# Patient Record
Sex: Female | Born: 1935
Health system: Southern US, Community
[De-identification: ages and names within clinical notes are randomized; demographics above are authoritative.]

## PROBLEM LIST (undated history)

## (undated) DIAGNOSIS — R112 Nausea with vomiting, unspecified: Secondary | ICD-10-CM

## (undated) DIAGNOSIS — E119 Type 2 diabetes mellitus without complications: Secondary | ICD-10-CM

## (undated) DIAGNOSIS — J45909 Unspecified asthma, uncomplicated: Secondary | ICD-10-CM

## (undated) DIAGNOSIS — M75100 Unspecified rotator cuff tear or rupture of unspecified shoulder, not specified as traumatic: Secondary | ICD-10-CM

## (undated) DIAGNOSIS — Z9889 Other specified postprocedural states: Secondary | ICD-10-CM

## (undated) DIAGNOSIS — M1990 Unspecified osteoarthritis, unspecified site: Secondary | ICD-10-CM

## (undated) DIAGNOSIS — M75122 Complete rotator cuff tear or rupture of left shoulder, not specified as traumatic: Secondary | ICD-10-CM

## (undated) DIAGNOSIS — K219 Gastro-esophageal reflux disease without esophagitis: Secondary | ICD-10-CM

## (undated) DIAGNOSIS — E785 Hyperlipidemia, unspecified: Secondary | ICD-10-CM

## (undated) DIAGNOSIS — H409 Unspecified glaucoma: Secondary | ICD-10-CM

## (undated) DIAGNOSIS — I10 Essential (primary) hypertension: Secondary | ICD-10-CM

## (undated) HISTORY — DX: Essential (primary) hypertension: I10

## (undated) HISTORY — PX: CHOLECYSTECTOMY: SHX55

## (undated) HISTORY — PX: ABDOMINAL HYSTERECTOMY: SHX81

## (undated) HISTORY — DX: Unspecified glaucoma: H40.9

## (undated) HISTORY — DX: Unspecified rotator cuff tear or rupture of unspecified shoulder, not specified as traumatic: M75.100

## (undated) HISTORY — DX: Unspecified asthma, uncomplicated: J45.909

## (undated) HISTORY — PX: BUNIONECTOMY WITH HAMMERTOE RECONSTRUCTION: SHX5600

## (undated) HISTORY — DX: Type 2 diabetes mellitus without complications: E11.9

## (undated) HISTORY — PX: EYE SURGERY: SHX253

## (undated) HISTORY — PX: WRIST FRACTURE SURGERY: SHX121

## (undated) HISTORY — PX: BLEPHAROPLASTY: SUR158

## (undated) HISTORY — PX: TRIGGER FINGER RELEASE: SHX641

## (undated) HISTORY — DX: Hyperlipidemia, unspecified: E78.5

---

## 1999-04-25 ENCOUNTER — Ambulatory Visit (HOSPITAL_COMMUNITY): Admission: RE | Admit: 1999-04-25 | Discharge: 1999-04-25 | Payer: Self-pay | Admitting: *Deleted

## 1999-12-31 ENCOUNTER — Encounter: Admission: RE | Admit: 1999-12-31 | Discharge: 1999-12-31 | Payer: Self-pay | Admitting: *Deleted

## 1999-12-31 ENCOUNTER — Encounter: Payer: Self-pay | Admitting: *Deleted

## 2000-04-08 ENCOUNTER — Encounter: Admission: RE | Admit: 2000-04-08 | Discharge: 2000-07-07 | Payer: Self-pay | Admitting: *Deleted

## 2000-12-31 ENCOUNTER — Encounter: Payer: Self-pay | Admitting: Internal Medicine

## 2000-12-31 ENCOUNTER — Encounter: Admission: RE | Admit: 2000-12-31 | Discharge: 2000-12-31 | Payer: Self-pay | Admitting: Internal Medicine

## 2001-11-22 ENCOUNTER — Encounter: Payer: Self-pay | Admitting: Internal Medicine

## 2001-11-22 ENCOUNTER — Encounter: Admission: RE | Admit: 2001-11-22 | Discharge: 2001-11-22 | Payer: Self-pay | Admitting: Internal Medicine

## 2001-11-24 HISTORY — PX: BREAST EXCISIONAL BIOPSY: SUR124

## 2002-01-06 ENCOUNTER — Encounter: Admission: RE | Admit: 2002-01-06 | Discharge: 2002-01-06 | Payer: Self-pay | Admitting: Internal Medicine

## 2002-01-06 ENCOUNTER — Encounter (INDEPENDENT_AMBULATORY_CARE_PROVIDER_SITE_OTHER): Payer: Self-pay | Admitting: *Deleted

## 2002-01-06 ENCOUNTER — Encounter: Payer: Self-pay | Admitting: Internal Medicine

## 2002-01-12 ENCOUNTER — Encounter (INDEPENDENT_AMBULATORY_CARE_PROVIDER_SITE_OTHER): Payer: Self-pay | Admitting: *Deleted

## 2002-01-12 ENCOUNTER — Ambulatory Visit (HOSPITAL_BASED_OUTPATIENT_CLINIC_OR_DEPARTMENT_OTHER): Admission: RE | Admit: 2002-01-12 | Discharge: 2002-01-12 | Payer: Self-pay | Admitting: General Surgery

## 2003-02-21 ENCOUNTER — Encounter: Payer: Self-pay | Admitting: Internal Medicine

## 2003-02-21 ENCOUNTER — Encounter: Admission: RE | Admit: 2003-02-21 | Discharge: 2003-02-21 | Payer: Self-pay | Admitting: Internal Medicine

## 2004-03-19 ENCOUNTER — Encounter: Admission: RE | Admit: 2004-03-19 | Discharge: 2004-03-19 | Payer: Self-pay | Admitting: Internal Medicine

## 2005-01-02 ENCOUNTER — Encounter: Admission: RE | Admit: 2005-01-02 | Discharge: 2005-04-02 | Payer: Self-pay | Admitting: Internal Medicine

## 2005-03-21 ENCOUNTER — Encounter: Admission: RE | Admit: 2005-03-21 | Discharge: 2005-03-21 | Payer: Self-pay | Admitting: Internal Medicine

## 2006-04-22 ENCOUNTER — Encounter: Admission: RE | Admit: 2006-04-22 | Discharge: 2006-04-22 | Payer: Self-pay | Admitting: Internal Medicine

## 2007-05-17 ENCOUNTER — Encounter: Admission: RE | Admit: 2007-05-17 | Discharge: 2007-05-17 | Payer: Self-pay | Admitting: Otolaryngology

## 2007-06-03 ENCOUNTER — Encounter: Admission: RE | Admit: 2007-06-03 | Discharge: 2007-06-03 | Payer: Self-pay | Admitting: Internal Medicine

## 2007-06-08 ENCOUNTER — Encounter: Admission: RE | Admit: 2007-06-08 | Discharge: 2007-09-06 | Payer: Self-pay | Admitting: Otolaryngology

## 2007-08-27 ENCOUNTER — Encounter: Admission: RE | Admit: 2007-08-27 | Discharge: 2007-08-27 | Payer: Self-pay | Admitting: Orthopedic Surgery

## 2007-08-31 ENCOUNTER — Ambulatory Visit (HOSPITAL_BASED_OUTPATIENT_CLINIC_OR_DEPARTMENT_OTHER): Admission: RE | Admit: 2007-08-31 | Discharge: 2007-09-01 | Payer: Self-pay | Admitting: Orthopedic Surgery

## 2008-07-21 ENCOUNTER — Encounter: Admission: RE | Admit: 2008-07-21 | Discharge: 2008-07-21 | Payer: Self-pay | Admitting: Internal Medicine

## 2009-07-25 ENCOUNTER — Encounter: Admission: RE | Admit: 2009-07-25 | Discharge: 2009-07-25 | Payer: Self-pay | Admitting: Internal Medicine

## 2010-05-07 ENCOUNTER — Ambulatory Visit (HOSPITAL_BASED_OUTPATIENT_CLINIC_OR_DEPARTMENT_OTHER): Admission: RE | Admit: 2010-05-07 | Discharge: 2010-05-07 | Payer: Self-pay | Admitting: Orthopedic Surgery

## 2010-08-02 ENCOUNTER — Encounter: Admission: RE | Admit: 2010-08-02 | Discharge: 2010-08-02 | Payer: Self-pay | Admitting: Internal Medicine

## 2011-04-08 NOTE — Op Note (Signed)
NAME:  Cheryl Quinn, Cheryl Quinn              ACCOUNT NO.:  [id number]   MEDICAL RECORD NO.:  [id number]          PATIENT TYPE:  AMB   LOCATION:  DSC                          FACILITY:  [facility]   PHYSICIAN:  Katy Fitch. Sypher, M.D. DATE OF BIRTH:  05-02-1933   DATE OF PROCEDURE:  08/31/2007  DATE OF DISCHARGE:                               OPERATIVE REPORT   PREOPERATIVE DIAGNOSIS:  Chronic left supraspinatus rotator cuff  retracted tear with evidence of chronic stage III impingement and severe  AC arthropathy.   POSTOPERATIVE DIAGNOSIS:  Chronic left supraspinatus rotator cuff  retracted tear with evidence of chronic stage III impingement and severe  AC arthropathy with further identification of extensive labral  degenerative tearing and 50% long head of biceps rupture just proximal  to intertubercular groove and grade 2 chondromalacia of humeral head.   OPERATION:  1. Diagnostic arthroscopy left glenohumeral joint.  2. Arthroscopic debridement of labrum, biceps tendon degenerative      tearing and rotator cuff degenerative tear fragments.  3. Arthroscopic subacromial decompression with bursectomy,      coracoacromial ligament relaxation and resection of the capsule of      AC joint.  4. Arthroscopic resection distal clavicle.  5. Reconstruction of chronic supraspinatus rotator cuff retracted      triangular tear with a single BioCorkscrew medial footprint anchor      and a McLaughlin through bone approximation suture.   OPERATING SURGEON:  Katy Fitch. Sypher, M.D.   ASSISTANT:  Molly Maduro Dasnoit PA-C.   ANESTHESIA:  General by endotracheal technique supplemented by a left  interscalene block.   SUPERVISING ANESTHESIOLOGIST:  Janetta Hora. Gelene Mink, M.D.   INDICATIONS:  The patient is a 77 year old woman referred through the  courtesy of Dr. Kirby Funk of [facility].  She had a history of  hand pain and shoulder pain.   Clinical examination revealed signs of a significant left  shoulder AC  arthropathy, weakness of abduction, external rotation and plain film  evidence of a probable chronic rotator cuff tear.   After lengthy discussion in the office, the patient was sent for a MRI  of the left shoulder which documented severe AC arthropathy and  unfavorable acromial anatomy as well as a chronic retracted rotator cuff  tear.   After detailed informed consent, she decided to proceed with attempted  repair of her left rotator cuff.  She understood that we would  arthroscopically debride her shoulder, perform arthroscopic subacromial  decompression, distal clavicle resection and repair rotator cuff through  a muscle-splitting incision.   Given the chronic nature of her tear, in my judgment this was not  amendable to arthroscopic technique.   Preoperatively she was advised of the potential risks and benefits of  surgery.  She is noted be a type 2 diabetic controlled by diet only.  Her random blood glucose was 153 on the day prior to surgery.   After informed consent and after questions were invited and answered in  the holding area, she is brought to the operating room at this time  anticipating arthroscopic evaluation of her shoulder followed by the  rotator cuff reconstruction.   PROCEDURE:  The patient is brought to the operating room and placed in  the supine position upon the operating table.   Following anesthesia consult by Dr. Gelene Mink, general anesthesia by  endotracheal technique was recommended.  Dr. Gelene Mink placed an  interscalene block in the holding area without complication.   The patient was transferred to room #6, placed in supine position upon  the operating table and under Dr. Thornton Dales strict supervision,  general endotracheal anesthesia induced.   She was carefully positioned in the beach-chair position with the aid of  a torso and head holder designed for shoulder arthroscopy.  One gram of  Ancef was administered as IV  prophylactic antibiotic one hour prior to  surgery.   Procedure commenced with routine DuraPrep of the arm and forequarter  followed by application of impervious arthroscopy drapes.  The  arthroscope was introduced through a standard posterior viewing portal.  Diagnostic arthroscopy revealed intact hyaline articular cartilage  surfaces on the glenoid.  The humeral head had multiple areas of grade 2  chondromalacia and irregular surface without complete loss of the  hyaline cartilage.  The long head of the biceps had a 50% degenerative  tear approximately 2.5 cm from its insertion on the superior labrum.  The labral anchor of the biceps was sound.   An anterior portal was created under direct vision followed by use of a  4.5-mm suction shaver to debride the labrum.  The biceps and the deep  surface of the rotator cuff.   A rather sizable 2.5-3.5 cm full-thickness rotator cuff tear was noted  in the supraspinatus and this was retracted at least 3 cm medially with  a triangular defect.   After completion of the intra-articular debridement, digital images were  obtained to document the pathology, followed by removal of the  arthroscope.  The scope was then placed in the subacromial space,  followed by debridement of bursa.  The unfavorable AC anatomy was  identified and documented with a digital camera, followed by resection  of the AC capsule and partial release of the coracoacromial ligament.  The distal 15 mm of clavicle was removed arthroscopically with a suction  bur brought in anteriorly.  Bleeding was controlled with bipolar  cautery.  The acromial morphology was leveled with a suction shaver and  suction bur followed by hemostasis.   We then proceeded directly to open repair.  Due to the patient's  mesomorphic status, a significant incision was fashioned to expose the  deltoid beginning at the anterior margin of the acromion.  This was  brought laterally to expose the junction  between the anterior and middle  thirds of the deltoid.  This was split with a scissors followed by  release of less than 1 cm of the deltoid origin.   An Arthrex retractor was placed allowing excellent visualization of the  cuff tear.   This was a rather complex tear that uncovered the long head of the  biceps.  The long head of the biceps was quite degenerative and  fibrillated.   In my judgment, the patient is at significant risk for a late rupture of  the long head of the biceps given the degree of pathology identified.   The cuff margins were freshened and a large reactive osteophyte at the  greater tuberosity lowered by 4 mm with a power bur.  A BioCorkscrew  anchor was placed in the medial footprint of the repair followed by  triangulation of  the repair with a gathering suture of #2 FiberWire that  was then placed through tunnels to form a McLaughlin lateralization and  Advertising account planner.   Two mattress sutures were placed with a BioCorkscrew to inset the medial  footprint.   The defect was closed about 95% with a very small triangular lateral  defect which should scar over the decorticated tuberosity.   A very satisfactory profile repair was achieved.   After power irrigation of the subacromial space, the deltoid split was  repaired with a corner suture of #2 FiberWire anchored in the periosteum  of the acromion and deltoid origin, followed by multiple figure-of-eight  sutures of 0 Vicryl closing the deltoid split.   There were no apparent complications.   The patient's wound was then repaired with subcutaneous sutures of 2-0  Vicryl and intradermal 3-0 Prolene with Steri-Strips.  The patient was  then awakened from general anesthesia and transferred to the recovery  room with stable vital signs.  She will be admitted to the recovery care  center for observation of her vital signs and IV antibiotics in the form  of Ancef 1 gram IV q.8h. x3 doses and  appropriate analgesics in the form  of p.o. an IV Dilaudid and possible IV PCA morphine.      Katy Fitch Sypher, M.D.  Electronically Signed     RVS/MEDQ  D:  08/31/2007  T:  08/31/2007  Job:  [id number]   cc:   Thora Lance, M.D.

## 2011-04-11 NOTE — Op Note (Signed)
. [facility]  Patient:    Cheryl Quinn, Cheryl Quinn Visit Number: [id number] MRN: [id number]          Service Type: Attending:  Rose Phi. Maple Hudson, M.D. Dictated by:   Rose Phi. Maple Hudson, M.D. Proc. Date: 01/12/02                             Operative Report  PREOPERATIVE DIAGNOSIS:  Probable hemangioma of the right breast.  POSTOPERATIVE DIAGNOSIS:  Probable hemangioma of the right breast.  PROCEDURE:  Excision of right breast mass.  SURGEON:  Rose Phi. Maple Hudson, M.D.  ANESTHESIA:  MAC.  DESCRIPTION OF PROCEDURE:  This patient had a palpable lesion of the right breast, which had had a needle biopsy done on it which showed a vascular proliferation favoring a hemangioma with a recommendation for excision.  The patient was placed on the operating table with the right arm extended on the arm board.  The palpable nodule was outlined and then a curvilinear incision outlined by it.  The incision was then made and the hemangioma and the surrounding breast tissue were excised.  Hemostasis was obtained with the cautery.  Subcuticular closure of 4-0 Monocryl and Steri-Strips carried out. Dressing applied.  The patient transferred to the recovery room in satisfactory condition, having tolerated the procedure well. Dictated by:   Rose Phi. Maple Hudson, M.D. Attending:  Rose Phi. Maple Hudson, M.D. DD:  01/12/02 TD:  01/12/02 Job: [id number]

## 2011-07-16 ENCOUNTER — Other Ambulatory Visit: Payer: Self-pay | Admitting: Internal Medicine

## 2011-07-16 DIAGNOSIS — Z1231 Encounter for screening mammogram for malignant neoplasm of breast: Secondary | ICD-10-CM

## 2011-08-20 ENCOUNTER — Ambulatory Visit: Payer: Self-pay

## 2011-09-03 ENCOUNTER — Ambulatory Visit
Admission: RE | Admit: 2011-09-03 | Discharge: 2011-09-03 | Disposition: A | Discharge status: Home / Self Care | Payer: Medicare Other | Source: Ambulatory Visit | Attending: Internal Medicine | Admitting: Internal Medicine

## 2011-09-03 DIAGNOSIS — Z1231 Encounter for screening mammogram for malignant neoplasm of breast: Secondary | ICD-10-CM

## 2011-09-04 LAB — I-STAT 8, (EC8 V) (CONVERTED LAB)
BUN: 17
HCT: 47 — ABNORMAL HIGH
Hemoglobin: 16 — ABNORMAL HIGH
Operator id: 279391
TCO2: 29

## 2011-09-04 LAB — POCT HEMOGLOBIN-HEMACUE
Hemoglobin: 15.5 — ABNORMAL HIGH
Operator id: 208731

## 2011-09-09 ENCOUNTER — Other Ambulatory Visit: Payer: Self-pay | Admitting: Internal Medicine

## 2011-09-09 DIAGNOSIS — R928 Other abnormal and inconclusive findings on diagnostic imaging of breast: Secondary | ICD-10-CM

## 2011-09-15 ENCOUNTER — Encounter (HOSPITAL_BASED_OUTPATIENT_CLINIC_OR_DEPARTMENT_OTHER)
Admission: RE | Admit: 2011-09-15 | Discharge: 2011-09-15 | Disposition: A | Discharge status: Home / Self Care | Payer: Medicare Other | Source: Ambulatory Visit | Attending: Orthopedic Surgery | Admitting: Orthopedic Surgery

## 2011-09-15 LAB — BASIC METABOLIC PANEL
Chloride: 99 mEq/L (ref 96–112)
GFR calc Af Amer: >90 mL/min (ref >90)
Glucose, Bld: 171 mg/dL — ABNORMAL HIGH (ref 70–99)
Potassium: 3.5 mEq/L (ref 3.5–5.1)

## 2011-09-16 ENCOUNTER — Ambulatory Visit (HOSPITAL_BASED_OUTPATIENT_CLINIC_OR_DEPARTMENT_OTHER)
Admission: RE | Admit: 2011-09-16 | Discharge: 2011-09-16 | Disposition: A | Discharge status: Home / Self Care | Payer: Medicare Other | Source: Ambulatory Visit | Attending: Orthopedic Surgery | Admitting: Orthopedic Surgery

## 2011-09-16 DIAGNOSIS — S52599A Other fractures of lower end of unspecified radius, initial encounter for closed fracture: Secondary | ICD-10-CM | POA: Insufficient documentation

## 2011-09-16 DIAGNOSIS — I10 Essential (primary) hypertension: Secondary | ICD-10-CM | POA: Insufficient documentation

## 2011-09-16 DIAGNOSIS — E119 Type 2 diabetes mellitus without complications: Secondary | ICD-10-CM | POA: Insufficient documentation

## 2011-09-16 DIAGNOSIS — W19XXXA Unspecified fall, initial encounter: Secondary | ICD-10-CM | POA: Insufficient documentation

## 2011-09-16 DIAGNOSIS — Z01810 Encounter for preprocedural cardiovascular examination: Secondary | ICD-10-CM | POA: Insufficient documentation

## 2011-09-16 DIAGNOSIS — Z01812 Encounter for preprocedural laboratory examination: Secondary | ICD-10-CM | POA: Insufficient documentation

## 2011-09-16 DIAGNOSIS — Y929 Unspecified place or not applicable: Secondary | ICD-10-CM | POA: Insufficient documentation

## 2011-09-16 LAB — GLUCOSE, CAPILLARY
Glucose-Capillary: 145 mg/dL — ABNORMAL HIGH (ref 70–99)
Glucose-Capillary: 156 mg/dL — ABNORMAL HIGH (ref 70–99)

## 2011-09-22 ENCOUNTER — Ambulatory Visit
Admission: RE | Admit: 2011-09-22 | Discharge: 2011-09-22 | Disposition: A | Discharge status: Home / Self Care | Payer: Medicare Other | Source: Ambulatory Visit | Attending: Internal Medicine | Admitting: Internal Medicine

## 2011-09-22 DIAGNOSIS — R928 Other abnormal and inconclusive findings on diagnostic imaging of breast: Secondary | ICD-10-CM

## 2011-09-26 NOTE — Op Note (Signed)
NAMEMarland Kitchen  OREE, MIRELEZ NO.:  [id number]  MEDICAL RECORD NO.:  [id number]  LOCATION:                                 FACILITY:  PHYSICIAN:  Cindee Salt, M.D.            DATE OF BIRTH:  DATE OF PROCEDURE:  09/16/2011 DATE OF DISCHARGE:                              OPERATIVE REPORT   PREOPERATIVE DIAGNOSIS:  Comminuted intra-articular fracture, right distal radius.  POSTOPERATIVE DIAGNOSIS:  Comminuted intra-articular fracture, right distal radius.  OPERATION:  Open reduction and internal fixation with allograft chips, Acumed DVR plate, right wrist.  SURGEON:  Cindee Salt, MD  ASSIST:  Betha Loa, MD  ANESTHESIA:  Axillary general.  ANESTHESIOLOGIST:  Burna Forts, MD  HISTORY:  The patient is a 77 year old female who suffered a fall, suffering comminuted intra-articular fracture of her right distal radius.  She is admitted now for open reduction and internal fixation, possible bone grafting.  Pre, peri and postoperative course have been discussed along with risks and complications.  She is aware there is no guarantee with the surgery, possibility of infection, recurrence of injury to arteries, nerves, tendons, incomplete relief of symptoms, dystrophy, breakage of plate penetration, failure of fixation with penetration of screws with collapse of the bone about the bone and bile piece.  In the preoperative area, the patient is seen, the extremity marked by both the patient and surgeon, and antibiotic given.  PROCEDURE:  The patient was brought to the operating room where a general anesthetic was carried out without difficulty after a axillary block.  She was prepped using ChloraPrep, supine position, right arm free.  A 3-minute dry time was allowed.  Time-out taken confirming the patient and procedure.  Fracture was reduced.  The tourniquet was placed high and the arm was inflated to 250 mmHg after exsanguination of the limb with an Esmarch bandage.   X-rays confirmed a partial reduction. The incision was made for volar distal plate, carried down through the subcutaneous tissue.  Bleeders were electrocauterized with bipolar.  The dissection carried down through the flexor carpi radialis tendon, tendon sheath protecting the radial artery.  The dissection was then carried to the radial aspect.  The pronator quadratus was elevated off after we making an incision.  The brachioradialis was then tenotomized.  The fracture was noted to be markedly comminuted with multiple fragments quite distal in nature.  The fracture was opened.  With the extended dorsal incision, the fracture cleaned out, the volar cortex was reduced. It was decided that there was a significant loss of bone dorsally and bone grafting would be placed, 5 mL of allograft chips were then soaked, these were inserted after crushing down.  A distal volar plate Acumed standard size was placed, this was pinned in position, found to lie in adequate position with the fracture well reduced.  The gliding screw was then applied, a 10-mm screw held this.  The x-rays confirmed positioning and distal pegs and screws were placed, these measured between 18 and 22 mm.  Initial screw was a nonlocking threaded screw, placed on the ulnar aspect bring the fracture fragment to the plate.  The remainder were  locking smooth pegs except for the two in the radial styloid, these were placed, measuring between 18 and 20 mm.  X-rays confirmed reduction of the fracture AP and lateral direction; however, the most distal styloid screw appeared to be outside the bone, this was replaced with a short 16- mm smooth peg.  Locking in nature, the remaining two proximal screws were then placed, these measured 12 mm.  The 10-mm screw was then transferred distally in that, it barely engaged the cortex.  The screws proximally were noted to be just slightly long; however, a 10-mm would be too short.  As such, these  were maintained.  The wound was copiously irrigated with saline.  X-rays were taken confirming no screws in the articular surface.  The full pronation and supination of the wrist was afforded.  The wound was closed in layers with 4-0 Vicryl in the pronator quadratus, the subcutaneous tissue with interrupted 4-0 Rapide in the skin.  A sterile compressive dressing, dorsal palmar splint was applied.  On deflation of the tourniquet, all fingers were immediately pinked.  She was taken to the recovery room for observation in satisfactory condition.  She will be discharged to home to return to the [facility] in 1 week, on Percocet.          ______________________________ Cindee Salt, M.D.     GK/MEDQ  D:  09/16/2011  T:  09/17/2011  Job:  [id number]  cc:   Arlys John D. Petrarca, P.A.-C.  Electronically Signed by Cindee Salt M.D. on 09/26/2011 03:44:30 PM

## 2012-08-20 ENCOUNTER — Other Ambulatory Visit: Payer: Self-pay | Admitting: Internal Medicine

## 2012-08-20 DIAGNOSIS — Z1231 Encounter for screening mammogram for malignant neoplasm of breast: Secondary | ICD-10-CM

## 2012-09-22 ENCOUNTER — Ambulatory Visit
Admission: RE | Admit: 2012-09-22 | Discharge: 2012-09-22 | Disposition: A | Discharge status: Home / Self Care | Payer: Medicare Other | Source: Ambulatory Visit | Attending: Internal Medicine | Admitting: Internal Medicine

## 2012-09-22 DIAGNOSIS — Z1231 Encounter for screening mammogram for malignant neoplasm of breast: Secondary | ICD-10-CM

## 2013-08-16 ENCOUNTER — Other Ambulatory Visit: Payer: Self-pay

## 2013-08-16 DIAGNOSIS — Z1231 Encounter for screening mammogram for malignant neoplasm of breast: Secondary | ICD-10-CM

## 2013-10-03 ENCOUNTER — Ambulatory Visit: Payer: Medicare Other

## 2013-10-11 ENCOUNTER — Ambulatory Visit
Admission: RE | Admit: 2013-10-11 | Discharge: 2013-10-11 | Disposition: A | Discharge status: Home / Self Care | Payer: Medicare Other | Source: Ambulatory Visit

## 2013-10-11 DIAGNOSIS — Z1231 Encounter for screening mammogram for malignant neoplasm of breast: Secondary | ICD-10-CM

## 2014-09-07 ENCOUNTER — Other Ambulatory Visit: Payer: Self-pay

## 2014-09-07 DIAGNOSIS — Z1231 Encounter for screening mammogram for malignant neoplasm of breast: Secondary | ICD-10-CM

## 2014-10-17 ENCOUNTER — Ambulatory Visit
Admission: RE | Admit: 2014-10-17 | Discharge: 2014-10-17 | Disposition: A | Discharge status: Home / Self Care | Payer: Medicare Other | Source: Ambulatory Visit

## 2014-10-17 DIAGNOSIS — Z1231 Encounter for screening mammogram for malignant neoplasm of breast: Secondary | ICD-10-CM

## 2015-05-08 ENCOUNTER — Encounter: Payer: Self-pay | Admitting: Podiatry

## 2015-05-08 ENCOUNTER — Ambulatory Visit (INDEPENDENT_AMBULATORY_CARE_PROVIDER_SITE_OTHER): Payer: Medicare Other | Admitting: Podiatry

## 2015-05-08 ENCOUNTER — Ambulatory Visit (INDEPENDENT_AMBULATORY_CARE_PROVIDER_SITE_OTHER): Payer: Medicare Other

## 2015-05-08 VITALS — BP 103/60 | HR 100 | Resp 12

## 2015-05-08 DIAGNOSIS — M79671 Pain in right foot: Secondary | ICD-10-CM

## 2015-05-08 DIAGNOSIS — M779 Enthesopathy, unspecified: Secondary | ICD-10-CM

## 2015-05-08 DIAGNOSIS — M79672 Pain in left foot: Secondary | ICD-10-CM

## 2015-05-08 MED ORDER — TRIAMCINOLONE ACETONIDE 10 MG/ML IJ SUSP
10.0000 mg | Freq: Once | INTRAMUSCULAR | Status: AC
  Administered 2015-05-08: 10 mg

## 2015-05-08 NOTE — Progress Notes (Signed)
   Subjective:    Patient ID: Cheryl Quinn, female    DOB: May 02, 1933, 81 y.o.   MRN: [id number]  HPI PT STATED B/L FEET HAVING SHOOTING PAIN AND SWOLLEN FOR 3 MONTHS. FEET ARE GETTING WORSE ESPECIALLY WHEN STANDING. TRIED NO TREATMENT.   Review of Systems  HENT: Positive for hearing loss.   Cardiovascular: Positive for leg swelling.       Objective:   Physical Exam        Assessment & Plan:

## 2015-05-14 NOTE — Progress Notes (Signed)
Subjective:     Patient ID: Cheryl Quinn, female   DOB: 1933-06-24, 81 y.o.   MRN: [id number]  HPI patient presents stating she's having pain on top of her right foot over left foot that makes walking difficult and she is worried that this could be related to arthritis she's had previous surgery   Review of Systems  All other systems reviewed and are negative.      Objective:   Physical Exam  Constitutional: She is oriented to person, place, and time.  Cardiovascular: Intact distal pulses.   Musculoskeletal: Normal range of motion.  Neurological: She is oriented to person, place, and time.  Skin: Skin is warm and dry.  Nursing note and vitals reviewed.  neurovascular status intact muscle strength adequate range of motion within normal limits. Patient's noted to have dorsal tendinitis around the midtarsal joint right over left with inflammation fluid buildup noted and pain when pressed and inability to walk comfortably     Assessment:     Probable arthritis with tendinitis-like symptoms and moderate structural instability of both feet    Plan:     H&P and all conditions discussed and x-rays reviewed with patient. Today I went ahead and I carefully injected the dorsal midtarsal joint 3 mg Kenalog 5 mg Xylocaine and advised on physical therapy. Patient will be seen back to recheck

## 2015-09-24 ENCOUNTER — Encounter (HOSPITAL_BASED_OUTPATIENT_CLINIC_OR_DEPARTMENT_OTHER): Payer: Self-pay | Admitting: *Deleted

## 2015-09-26 ENCOUNTER — Other Ambulatory Visit: Payer: Self-pay | Admitting: Orthopedic Surgery

## 2015-09-27 NOTE — Progress Notes (Signed)
Spoke with Dr Dion SaucierLandau he said only do what anesthesia requires for lab work.  Cancell his orders

## 2015-09-28 ENCOUNTER — Ambulatory Visit (HOSPITAL_BASED_OUTPATIENT_CLINIC_OR_DEPARTMENT_OTHER): Payer: Medicare Other | Admitting: Anesthesiology

## 2015-09-28 ENCOUNTER — Encounter (HOSPITAL_BASED_OUTPATIENT_CLINIC_OR_DEPARTMENT_OTHER): Payer: Self-pay | Admitting: *Deleted

## 2015-09-28 ENCOUNTER — Encounter (HOSPITAL_BASED_OUTPATIENT_CLINIC_OR_DEPARTMENT_OTHER)
Admission: RE | Disposition: A | Discharge status: Home / Self Care | Payer: Self-pay | Source: Ambulatory Visit | Attending: Orthopedic Surgery

## 2015-09-28 ENCOUNTER — Ambulatory Visit (HOSPITAL_BASED_OUTPATIENT_CLINIC_OR_DEPARTMENT_OTHER)
Admission: RE | Admit: 2015-09-28 | Discharge: 2015-09-28 | Disposition: A | Discharge status: Home / Self Care | Payer: Medicare Other | Source: Ambulatory Visit | Attending: Orthopedic Surgery | Admitting: Orthopedic Surgery

## 2015-09-28 DIAGNOSIS — Z79899 Other long term (current) drug therapy: Secondary | ICD-10-CM | POA: Diagnosis not present

## 2015-09-28 DIAGNOSIS — H9190 Unspecified hearing loss, unspecified ear: Secondary | ICD-10-CM | POA: Insufficient documentation

## 2015-09-28 DIAGNOSIS — I4581 Long QT syndrome: Secondary | ICD-10-CM | POA: Diagnosis not present

## 2015-09-28 DIAGNOSIS — M2141 Flat foot [pes planus] (acquired), right foot: Secondary | ICD-10-CM | POA: Insufficient documentation

## 2015-09-28 DIAGNOSIS — H409 Unspecified glaucoma: Secondary | ICD-10-CM | POA: Insufficient documentation

## 2015-09-28 DIAGNOSIS — Z85828 Personal history of other malignant neoplasm of skin: Secondary | ICD-10-CM | POA: Insufficient documentation

## 2015-09-28 DIAGNOSIS — M8580 Other specified disorders of bone density and structure, unspecified site: Secondary | ICD-10-CM | POA: Insufficient documentation

## 2015-09-28 DIAGNOSIS — E119 Type 2 diabetes mellitus without complications: Secondary | ICD-10-CM | POA: Diagnosis not present

## 2015-09-28 DIAGNOSIS — Z9049 Acquired absence of other specified parts of digestive tract: Secondary | ICD-10-CM | POA: Diagnosis not present

## 2015-09-28 DIAGNOSIS — E669 Obesity, unspecified: Secondary | ICD-10-CM | POA: Insufficient documentation

## 2015-09-28 DIAGNOSIS — Z6834 Body mass index (BMI) 34.0-34.9, adult: Secondary | ICD-10-CM | POA: Insufficient documentation

## 2015-09-28 DIAGNOSIS — M2142 Flat foot [pes planus] (acquired), left foot: Secondary | ICD-10-CM | POA: Diagnosis not present

## 2015-09-28 DIAGNOSIS — E785 Hyperlipidemia, unspecified: Secondary | ICD-10-CM | POA: Insufficient documentation

## 2015-09-28 DIAGNOSIS — R609 Edema, unspecified: Secondary | ICD-10-CM | POA: Diagnosis not present

## 2015-09-28 DIAGNOSIS — Z86010 Personal history of colonic polyps: Secondary | ICD-10-CM | POA: Diagnosis not present

## 2015-09-28 DIAGNOSIS — H9319 Tinnitus, unspecified ear: Secondary | ICD-10-CM | POA: Diagnosis not present

## 2015-09-28 DIAGNOSIS — R531 Weakness: Secondary | ICD-10-CM | POA: Diagnosis not present

## 2015-09-28 DIAGNOSIS — Z7984 Long term (current) use of oral hypoglycemic drugs: Secondary | ICD-10-CM | POA: Diagnosis not present

## 2015-09-28 DIAGNOSIS — Z87891 Personal history of nicotine dependence: Secondary | ICD-10-CM | POA: Insufficient documentation

## 2015-09-28 DIAGNOSIS — M75122 Complete rotator cuff tear or rupture of left shoulder, not specified as traumatic: Secondary | ICD-10-CM | POA: Diagnosis present

## 2015-09-28 DIAGNOSIS — I10 Essential (primary) hypertension: Secondary | ICD-10-CM | POA: Insufficient documentation

## 2015-09-28 DIAGNOSIS — K5730 Diverticulosis of large intestine without perforation or abscess without bleeding: Secondary | ICD-10-CM | POA: Diagnosis not present

## 2015-09-28 DIAGNOSIS — K219 Gastro-esophageal reflux disease without esophagitis: Secondary | ICD-10-CM | POA: Diagnosis not present

## 2015-09-28 DIAGNOSIS — M75102 Unspecified rotator cuff tear or rupture of left shoulder, not specified as traumatic: Secondary | ICD-10-CM | POA: Insufficient documentation

## 2015-09-28 DIAGNOSIS — Z9889 Other specified postprocedural states: Secondary | ICD-10-CM | POA: Insufficient documentation

## 2015-09-28 DIAGNOSIS — Z7951 Long term (current) use of inhaled steroids: Secondary | ICD-10-CM | POA: Diagnosis not present

## 2015-09-28 DIAGNOSIS — M2040 Other hammer toe(s) (acquired), unspecified foot: Secondary | ICD-10-CM | POA: Insufficient documentation

## 2015-09-28 DIAGNOSIS — J4520 Mild intermittent asthma, uncomplicated: Secondary | ICD-10-CM | POA: Insufficient documentation

## 2015-09-28 HISTORY — DX: Nausea with vomiting, unspecified: R11.2

## 2015-09-28 HISTORY — DX: Other specified postprocedural states: Z98.890

## 2015-09-28 HISTORY — DX: Complete rotator cuff tear or rupture of left shoulder, not specified as traumatic: M75.122

## 2015-09-28 HISTORY — DX: Unspecified osteoarthritis, unspecified site: M19.90

## 2015-09-28 HISTORY — PX: SHOULDER ARTHROSCOPY WITH ROTATOR CUFF REPAIR: SHX5685

## 2015-09-28 HISTORY — DX: Gastro-esophageal reflux disease without esophagitis: K21.9

## 2015-09-28 LAB — GLUCOSE, CAPILLARY
Glucose-Capillary: 166 mg/dL — ABNORMAL HIGH (ref 65–99)
Glucose-Capillary: 160 mg/dL — ABNORMAL HIGH (ref 65–99)

## 2015-09-28 SURGERY — ARTHROSCOPY, SHOULDER, WITH ROTATOR CUFF REPAIR
Anesthesia: Regional | Site: Shoulder | Laterality: Left

## 2015-09-28 MED ORDER — PROPOFOL 500 MG/50ML IV EMUL
INTRAVENOUS | Status: AC
  Filled 2015-09-28: qty 50

## 2015-09-28 MED ORDER — BACLOFEN 10 MG PO TABS: 10.0000 mg | ORAL_TABLET | Freq: Three times a day (TID) | ORAL | Status: DC

## 2015-09-28 MED ORDER — FENTANYL CITRATE (PF) 100 MCG/2ML IJ SOLN
INTRAMUSCULAR | Status: AC
  Filled 2015-09-28: qty 4

## 2015-09-28 MED ORDER — SENNA-DOCUSATE SODIUM 8.6-50 MG PO TABS: 2.0000 | ORAL_TABLET | Freq: Every day | ORAL | Status: DC

## 2015-09-28 MED ORDER — PROPOFOL 10 MG/ML IV BOLUS
INTRAVENOUS | Status: DC | PRN
  Administered 2015-09-28: 150 mg via INTRAVENOUS

## 2015-09-28 MED ORDER — SUCCINYLCHOLINE CHLORIDE 20 MG/ML IJ SOLN
INTRAMUSCULAR | Status: DC | PRN
  Administered 2015-09-28: 120 mg via INTRAVENOUS

## 2015-09-28 MED ORDER — FENTANYL CITRATE (PF) 100 MCG/2ML IJ SOLN
INTRAMUSCULAR | Status: AC
  Filled 2015-09-28: qty 2

## 2015-09-28 MED ORDER — BUPIVACAINE-EPINEPHRINE (PF) 0.5% -1:200000 IJ SOLN
INTRAMUSCULAR | Status: DC | PRN
  Administered 2015-09-28: 25 mL via PERINEURAL

## 2015-09-28 MED ORDER — ONDANSETRON HCL 4 MG/2ML IJ SOLN
INTRAMUSCULAR | Status: DC | PRN
  Administered 2015-09-28: 4 mg via INTRAVENOUS

## 2015-09-28 MED ORDER — LIDOCAINE HCL (CARDIAC) 20 MG/ML IV SOLN
INTRAVENOUS | Status: AC
  Filled 2015-09-28: qty 5

## 2015-09-28 MED ORDER — MIDAZOLAM HCL 2 MG/2ML IJ SOLN
1.0000 mg | INTRAMUSCULAR | Status: DC | PRN
  Administered 2015-09-28 (×2): 1 mg via INTRAVENOUS

## 2015-09-28 MED ORDER — HYDROMORPHONE HCL 1 MG/ML IJ SOLN: 0.2500 mg | INTRAMUSCULAR | Status: DC | PRN

## 2015-09-28 MED ORDER — OXYCODONE-ACETAMINOPHEN 5-325 MG PO TABS: 1.0000 | ORAL_TABLET | Freq: Four times a day (QID) | ORAL | Status: DC | PRN

## 2015-09-28 MED ORDER — MIDAZOLAM HCL 2 MG/2ML IJ SOLN
INTRAMUSCULAR | Status: AC
  Filled 2015-09-28: qty 2

## 2015-09-28 MED ORDER — DEXAMETHASONE SODIUM PHOSPHATE 10 MG/ML IJ SOLN
INTRAMUSCULAR | Status: AC
  Filled 2015-09-28: qty 1

## 2015-09-28 MED ORDER — MEPERIDINE HCL 25 MG/ML IJ SOLN: 6.2500 mg | INTRAMUSCULAR | Status: DC | PRN

## 2015-09-28 MED ORDER — FENTANYL CITRATE (PF) 100 MCG/2ML IJ SOLN
50.0000 ug | INTRAMUSCULAR | Status: DC | PRN
  Administered 2015-09-28: 50 ug via INTRAVENOUS
  Administered 2015-09-28: 100 ug via INTRAVENOUS

## 2015-09-28 MED ORDER — ONDANSETRON HCL 4 MG PO TABS: 4.0000 mg | ORAL_TABLET | Freq: Three times a day (TID) | ORAL | Status: DC | PRN

## 2015-09-28 MED ORDER — PHENYLEPHRINE HCL 10 MG/ML IJ SOLN
10.0000 mg | INTRAVENOUS | Status: DC | PRN
  Administered 2015-09-28: 40 ug/min via INTRAVENOUS

## 2015-09-28 MED ORDER — MIDAZOLAM HCL 2 MG/2ML IJ SOLN
INTRAMUSCULAR | Status: AC
  Filled 2015-09-28: qty 4

## 2015-09-28 MED ORDER — CEFAZOLIN SODIUM-DEXTROSE 2-3 GM-% IV SOLR
INTRAVENOUS | Status: AC
Start: 2015-09-28 — End: 2015-09-28
  Filled 2015-09-28: qty 50

## 2015-09-28 MED ORDER — LIDOCAINE HCL (CARDIAC) 20 MG/ML IV SOLN
INTRAVENOUS | Status: DC | PRN
  Administered 2015-09-28: 50 mg via INTRAVENOUS

## 2015-09-28 MED ORDER — PHENYLEPHRINE 40 MCG/ML (10ML) SYRINGE FOR IV PUSH (FOR BLOOD PRESSURE SUPPORT)
PREFILLED_SYRINGE | INTRAVENOUS | Status: AC
  Filled 2015-09-28: qty 10

## 2015-09-28 MED ORDER — SCOPOLAMINE 1 MG/3DAYS TD PT72: 1.0000 | MEDICATED_PATCH | Freq: Once | TRANSDERMAL | Status: DC | PRN

## 2015-09-28 MED ORDER — DEXAMETHASONE SODIUM PHOSPHATE 4 MG/ML IJ SOLN
INTRAMUSCULAR | Status: DC | PRN
  Administered 2015-09-28: 8 mg via INTRAVENOUS

## 2015-09-28 MED ORDER — SODIUM CHLORIDE 0.9 % IR SOLN
Status: DC | PRN
  Administered 2015-09-28: 24000 mL

## 2015-09-28 MED ORDER — LACTATED RINGERS IV SOLN
INTRAVENOUS | Status: DC
  Administered 2015-09-28: 10:00:00 via INTRAVENOUS

## 2015-09-28 MED ORDER — EPHEDRINE SULFATE 50 MG/ML IJ SOLN
INTRAMUSCULAR | Status: DC | PRN
  Administered 2015-09-28 (×2): 10 mg via INTRAVENOUS

## 2015-09-28 MED ORDER — ONDANSETRON HCL 4 MG/2ML IJ SOLN
INTRAMUSCULAR | Status: AC
  Filled 2015-09-28: qty 2

## 2015-09-28 MED ORDER — OXYCODONE HCL 5 MG/5ML PO SOLN: 5.0000 mg | Freq: Once | ORAL | Status: DC | PRN

## 2015-09-28 MED ORDER — GLYCOPYRROLATE 0.2 MG/ML IJ SOLN: 0.2000 mg | Freq: Once | INTRAMUSCULAR | Status: DC | PRN

## 2015-09-28 MED ORDER — CEFAZOLIN SODIUM-DEXTROSE 2-3 GM-% IV SOLR
2.0000 g | INTRAVENOUS | Status: AC
  Administered 2015-09-28: 2 g via INTRAVENOUS

## 2015-09-28 MED ORDER — SUCCINYLCHOLINE CHLORIDE 20 MG/ML IJ SOLN
INTRAMUSCULAR | Status: AC
  Filled 2015-09-28: qty 1

## 2015-09-28 MED ORDER — OXYCODONE HCL 5 MG PO TABS: 5.0000 mg | ORAL_TABLET | Freq: Once | ORAL | Status: DC | PRN

## 2015-09-28 SURGICAL SUPPLY — 66 items
ANCHOR SUT BIO SW 4.75X19.1 (Anchor) ×6 IMPLANT
BLADE CUTTER GATOR 3.5 (BLADE) ×3 IMPLANT
BLADE GREAT WHITE 4.2 (BLADE) IMPLANT
BLADE GREAT WHITE 4.2MM (BLADE)
BLADE SURG 15 STRL LF DISP TIS (BLADE) IMPLANT
BLADE SURG 15 STRL SS (BLADE)
BUR OVAL 6.0 (BURR) ×3 IMPLANT
CANNULA 5.75X71 LONG (CANNULA) ×3 IMPLANT
CANNULA TWIST IN 8.25X7CM (CANNULA) ×3 IMPLANT
CLOSURE STERI-STRIP 1/2X4 (GAUZE/BANDAGES/DRESSINGS) ×1
CLSR STERI-STRIP ANTIMIC 1/2X4 (GAUZE/BANDAGES/DRESSINGS) ×2 IMPLANT
DECANTER SPIKE VIAL GLASS SM (MISCELLANEOUS) IMPLANT
DRAPE INCISE IOBAN 66X45 STRL (DRAPES) ×3 IMPLANT
DRAPE SHOULDER BEACH CHAIR (DRAPES) ×3 IMPLANT
DRAPE U 20/CS (DRAPES) ×3 IMPLANT
DRAPE U-SHAPE 47X51 STRL (DRAPES) ×3 IMPLANT
DRSG PAD ABDOMINAL 8X10 ST (GAUZE/BANDAGES/DRESSINGS) ×3 IMPLANT
DURAPREP 26ML APPLICATOR (WOUND CARE) ×3 IMPLANT
ELECT REM PT RETURN 9FT ADLT (ELECTROSURGICAL)
ELECTRODE REM PT RTRN 9FT ADLT (ELECTROSURGICAL) IMPLANT
FIBERSTICK 2 (SUTURE) IMPLANT
GAUZE SPONGE 4X4 12PLY STRL (GAUZE/BANDAGES/DRESSINGS) ×3 IMPLANT
GLOVE BIO SURGEON STRL SZ 6.5 (GLOVE) ×6 IMPLANT
GLOVE BIO SURGEON STRL SZ8 (GLOVE) ×3 IMPLANT
GLOVE BIO SURGEONS STRL SZ 6.5 (GLOVE) ×3
GLOVE BIOGEL PI IND STRL 7.0 (GLOVE) ×4 IMPLANT
GLOVE BIOGEL PI IND STRL 8 (GLOVE) ×2 IMPLANT
GLOVE BIOGEL PI INDICATOR 7.0 (GLOVE) ×8
GLOVE BIOGEL PI INDICATOR 8 (GLOVE) ×4
GLOVE ORTHO TXT STRL SZ7.5 (GLOVE) ×3 IMPLANT
GOWN STRL REUS W/ TWL LRG LVL3 (GOWN DISPOSABLE) ×2 IMPLANT
GOWN STRL REUS W/ TWL XL LVL3 (GOWN DISPOSABLE) ×2 IMPLANT
GOWN STRL REUS W/TWL LRG LVL3 (GOWN DISPOSABLE) ×4
GOWN STRL REUS W/TWL XL LVL3 (GOWN DISPOSABLE) ×4
IMMOBILIZER SHOULDER FOAM XLGE (SOFTGOODS) IMPLANT
KIT SHOULDER TRACTION (DRAPES) ×3 IMPLANT
LASSO 90 CVE QUICKPAS (DISPOSABLE) ×3 IMPLANT
MANIFOLD NEPTUNE II (INSTRUMENTS) ×3 IMPLANT
NEEDLE SCORPION MULTI FIRE (NEEDLE) IMPLANT
PACK ARTHROSCOPY DSU (CUSTOM PROCEDURE TRAY) ×3 IMPLANT
PACK BASIN DAY SURGERY FS (CUSTOM PROCEDURE TRAY) ×3 IMPLANT
SET ARTHROSCOPY TUBING (MISCELLANEOUS) ×2
SET ARTHROSCOPY TUBING LN (MISCELLANEOUS) ×1 IMPLANT
SHEET MEDIUM DRAPE 40X70 STRL (DRAPES) ×3 IMPLANT
SLEEVE SCD COMPRESS KNEE MED (MISCELLANEOUS) ×3 IMPLANT
SLING ARM IMMOBILIZER LRG (SOFTGOODS) ×3 IMPLANT
SLING ARM IMMOBILIZER MED (SOFTGOODS) IMPLANT
SLING ARM LRG ADULT FOAM STRAP (SOFTGOODS) IMPLANT
SLING ARM MED ADULT FOAM STRAP (SOFTGOODS) IMPLANT
SLING ARM XL FOAM STRAP (SOFTGOODS) IMPLANT
SUT FIBERWIRE #2 38 T-5 BLUE (SUTURE)
SUT MNCRL AB 4-0 PS2 18 (SUTURE) ×3 IMPLANT
SUT PDS AB 0 CT 36 (SUTURE) IMPLANT
SUT PDS AB 1 CT  36 (SUTURE)
SUT PDS AB 1 CT 36 (SUTURE) IMPLANT
SUT TIGER TAPE 7 IN WHITE (SUTURE) ×3 IMPLANT
SUT VIC AB 3-0 SH 27 (SUTURE)
SUT VIC AB 3-0 SH 27X BRD (SUTURE) IMPLANT
SUTURE FIBERWR #2 38 T-5 BLUE (SUTURE) IMPLANT
TAPE FIBER 2MM 7IN #2 BLUE (SUTURE) ×3 IMPLANT
TOWEL OR 17X24 6PK STRL BLUE (TOWEL DISPOSABLE) ×3 IMPLANT
TOWEL OR NON WOVEN STRL DISP B (DISPOSABLE) ×3 IMPLANT
TUBE CONNECTING 20'X1/4 (TUBING) ×1
TUBE CONNECTING 20X1/4 (TUBING) ×2 IMPLANT
WAND STAR VAC 90 (SURGICAL WAND) ×3 IMPLANT
WATER STERILE IRR 1000ML POUR (IV SOLUTION) ×3 IMPLANT

## 2015-09-28 NOTE — Anesthesia Procedure Notes (Addendum)
Anesthesia Regional Block:  Interscalene brachial plexus block  Pre-Anesthetic Checklist: ,, timeout performed, Correct Patient, Correct Site, Correct Laterality, Correct Procedure, Correct Position, site marked, Risks and benefits discussed,  Surgical consent,  Pre-op evaluation,  At surgeon's request and post-op pain management  Laterality: Left and Upper  Prep: chloraprep       Needles:  Injection technique: Single-shot  Needle Type: Echogenic Needle     Needle Length: 5cm 5 cm Needle Gauge: 21 and 21 G    Additional Needles:  Procedures: ultrasound guided (picture in chart) Interscalene brachial plexus block Narrative:  Start time: 09/28/2015 10:17 AM End time: 09/28/2015 10:22 AM Injection made incrementally with aspirations every 5 mL.  Performed by: Personally  Anesthesiologist: CREWS, DAVID   Procedure Name: Intubation Date/Time: 09/28/2015 11:06 AM Performed by: Zenia ResidesPAYNE, [place] Kielbasa D Pre-anesthesia Checklist: Patient identified, Emergency Drugs available, Suction available and Patient being monitored Patient Re-evaluated:Patient Re-evaluated prior to inductionOxygen Delivery Method: Circle System Utilized Preoxygenation: Pre-oxygenation with 100% oxygen Intubation Type: IV induction Ventilation: Mask ventilation without difficulty Laryngoscope Size: Mac and 3 Grade View: Grade I Tube type: Oral Tube size: 7.0 mm Number of attempts: 1 Airway Equipment and Method: Stylet and Oral airway Placement Confirmation: ETT inserted through vocal cords under direct vision,  positive ETCO2 and breath sounds checked- equal and bilateral Secured at: 22 cm Tube secured with: Tape Dental Injury: Teeth and Oropharynx as per pre-operative assessment       Left IS block image

## 2015-09-28 NOTE — Transfer of Care (Signed)
Immediate Anesthesia Transfer of Care Note  Patient: Cheryl EarthlyDolores D Buresh  Procedure(s) Performed: Procedure(s): LEFT SHOULDER ARTHROSCOPY DEBRIDEMENT, ROTATOR CUFF REPAIR (Left)  Patient Location: PACU  Anesthesia Type:GA combined with regional for post-op pain  Level of Consciousness: sedated  Airway & Oxygen Therapy: Patient Spontanous Breathing and Patient connected to face mask oxygen  Post-op Assessment: Report given to RN and Post -op Vital signs reviewed and stable  Post vital signs: Reviewed and stable  Last Vitals:  Filed Vitals:   09/28/15 1017  BP: 134/66  Pulse: 62  Temp:   Resp: 15    Complications: No apparent anesthesia complications

## 2015-09-28 NOTE — Anesthesia Postprocedure Evaluation (Signed)
  Anesthesia Post-op Note  Patient: Cheryl Quinn  Procedure(s) Performed: Procedure(s): LEFT SHOULDER ARTHROSCOPY DEBRIDEMENT, ROTATOR CUFF REPAIR (Left)  Patient Location: PACU  Anesthesia Type: General, Regional   Level of Consciousness: awake, alert  and oriented  Airway and Oxygen Therapy: Patient Spontanous Breathing  Post-op Pain: mild  Post-op Assessment: Post-op Vital signs reviewed  Post-op Vital Signs: Reviewed  Last Vitals:  Filed Vitals:   09/28/15 1400  BP: 129/67  Pulse: 87  Temp:   Resp: 22    Complications: No apparent anesthesia complications

## 2015-09-28 NOTE — Discharge Instructions (Signed)
Diet: As you were doing prior to hospitalization  ° °Shower:  May shower but keep the wounds dry, use an occlusive plastic wrap, NO SOAKING IN TUB.  If the bandage gets wet, change with a clean dry gauze.  If you have a splint on, leave the splint in place and keep the splint dry with a plastic bag. ° °Dressing:  You may change your dressing 3-5 days after surgery, unless you have a splint.  If you have a splint, then just leave the splint in place and we will change your bandages during your first follow-up appointment.   ° °If you had hand or foot surgery, we will plan to remove your stitches in about 2 weeks in the office.  For all other surgeries, there are sticky tapes (steri-strips) on your wounds and all the stitches are absorbable.  Leave the steri-strips in place when changing your dressings, they will peel off with time, usually 2-3 weeks. ° °Activity:  Increase activity slowly as tolerated, but follow the weight bearing instructions below.  The rules on driving is that you can not be taking narcotics while you drive, and you must feel in control of the vehicle.   ° °Weight Bearing:   Sling at all times..   ° °To prevent constipation: you may use a stool softener such as - ° °Colace (over the counter) 100 mg by mouth twice a day  °Drink plenty of fluids (prune juice may be helpful) and high fiber foods °Miralax (over the counter) for constipation as needed.   ° °Itching:  If you experience itching with your medications, try taking only a single pain pill, or even half a pain pill at a time.  You may take up to 10 pain pills per day, and you can also use benadryl over the counter for itching or also to help with sleep.  ° °Precautions:  If you experience chest pain or shortness of breath - call 911 immediately for transfer to the hospital emergency department!! ° °If you develop a fever greater that 101 F, purulent drainage from wound, increased redness or drainage from wound, or calf pain -- Call the  office at [phone]                                                °Follow- Up Appointment:  Please call for an appointment to be seen in 2 weeks [place] - [phone] ° °Regional Anesthesia Blocks ° °1. Numbness or the inability to move the "blocked" extremity may last from 3-48 hours after placement. The length of time depends on the medication injected and your individual response to the medication. If the numbness is not going away after 48 hours, call your surgeon. ° °2. The extremity that is blocked will need to be protected until the numbness is gone and the  Strength has returned. Because you cannot feel it, you will need to take extra care to avoid injury. Because it may be weak, you may have difficulty moving it or using it. You may not know what position it is in without looking at it while the block is in effect. ° °3. For blocks in the legs and feet, returning to weight bearing and walking needs to be done carefully. You will need to wait until the numbness is entirely gone and the strength has returned. You should be   able to move your leg and foot normally before you try and bear weight or walk. You will need someone to be with you when you first try to ensure you do not fall and possibly risk injury. ° °4. Bruising and tenderness at the needle site are common side effects and will resolve in a few days. ° °5. Persistent numbness or new problems with movement should be communicated to the surgeon or the [facility] ([phone])/ [facility] ([phone]). ° °Post Anesthesia Home Care Instructions ° °Activity: °Get plenty of rest for the remainder of the day. A responsible adult should stay with you for 24 hours following the procedure.  °For the next 24 hours, DO NOT: °-Drive a car °-Operate machinery °-Drink alcoholic beverages °-Take any medication unless instructed by your physician °-Make any legal decisions or sign important papers. ° °Meals: °Start with liquid  foods such as gelatin or soup. Progress to regular foods as tolerated. Avoid greasy, spicy, heavy foods. If nausea and/or vomiting occur, drink only clear liquids until the nausea and/or vomiting subsides. Call your physician if vomiting continues. ° °Special Instructions/Symptoms: °Your throat may feel dry or sore from the anesthesia or the breathing tube placed in your throat during surgery. If this causes discomfort, gargle with warm salt water. The discomfort should disappear within 24 hours. ° °If you had a scopolamine patch placed behind your ear for the management of post- operative nausea and/or vomiting: ° °1. The medication in the patch is effective for 72 hours, after which it should be removed.  Wrap patch in a tissue and discard in the trash. Wash hands thoroughly with soap and water. °2. You may remove the patch earlier than 72 hours if you experience unpleasant side effects which may include dry mouth, dizziness or visual disturbances. °3. Avoid touching the patch. Wash your hands with soap and water after contact with the patch. °  ° ° ° ° °

## 2015-09-28 NOTE — Progress Notes (Signed)
Assisted Dr. Crews with left, ultrasound guided, interscalene  block. Side rails up, monitors on throughout procedure. See vital signs in flow sheet. Tolerated Procedure well. 

## 2015-09-28 NOTE — Op Note (Signed)
09/28/2015  12:46 PM  PATIENT:  Cheryl Quinn    PRE-OPERATIVE DIAGNOSIS:  Left shoulder massive possibly irreparable recurrent rotator cuff tear involving supraspinatus, infraspinatus and subscapularis  POST-OPERATIVE DIAGNOSIS:  Same, with early rotator cuff arthropathy  PROCEDURE:  Left shoulder arthroscopy with extensive debridement, with repair of subscapularis and advancement of infraspinatus  SURGEON:  Eulas Post, MD  PHYSICIAN ASSISTANT: Janace Litten, OPA-C, present and scrubbed throughout the case, critical for completion in a timely fashion, and for retraction, instrumentation, and closure.  ANESTHESIA:   General  PREOPERATIVE INDICATIONS:  Cheryl Quinn is a  81 y.o. female who had a previous left rotator cuff tear, and had surgical intervention done many years ago, who presented with recurrent shoulder pain, inability to function, with a recurrent rotator cuff tear. She elected for surgical management.  The risks benefits and alternatives were discussed with the patient preoperatively including but not limited to the risks of infection, bleeding, nerve injury, cardiopulmonary complications, the need for revision surgery, among others, and the patient was willing to proceed. We also discussed the risks for recurrent tear, progression of rotator cuff arthropathy, the potential need for reverse total shoulder replacement.  OPERATIVE IMPLANTS: Arthrex bio composite 4.75 mm swivel lock 1 with an inverted fiber tape for the subscapularis, with an inverted fiber tape for the infraspinatus and an additional 4.75 mm bio composite swivel lock.  OPERATIVE FINDINGS: There was a massive retracted tear of both the subscapularis and supraspinatus and infraspinatus. Infraspinatus still had some fibers attached, but the subscapularis had severe atrophy and was not amenable to repair. I was able to repair the subscapularis however and the tissue quality was reasonably good. Bone quality  was fair. The infraspinatus had a split tear with delamination, and I was able to secure the deep infraspinatus as well as the superficial tendon and secure this back to bone. The shoulder had full motion during examination under anesthesia, the biceps tendon was scarred down very low in the rotator interval, and I am not even 100% confident that it was in fact the biceps tendon, rather than just simply severe scar. It was a lot of scar throughout from the open previous procedure.  OPERATIVE PROCEDURE: The patient was brought to the operating room and placed in supine position. Gen. anesthesia was administered. IV antibiotics were given, although she has an allergy to some antibiotics, this is really more of a side effect where she had diarrhea, and with a single dose of antibiotics for antimicrobial prophylaxis we felt that this was appropriate. Switch help minimize her risks of infection.  She was turned in the semilateral decubitus position and all bony prominences padded. The left upper extremity is prepped and draped in usual sterile fashion and timeout was performed. She was in 15 pounds of traction. Diagnostic arthroscopy was carried out the above-named findings. There is A shaver was used to debride the anterior labrum and superior labrum as well as the biceps tendon and the biceps was released, if in fact it really was the biceps. It didn't really retract like I would've expected, and it may have simply been thickened bands of scar tissue anteriorly. Nonetheless the subscapularis was completely torn, and I placed 2 anterior cannulas, mobilized the tissue, resected the rotator interval with the ArthriCare, and then grasped the tendon and passed a horizontal mattress fiber tape. I also used a 6-0 bur into the lesser tuberosity, and once the bed had been prepared I used a punch and then secured  the tendon. Excellent advancement of the subscapularis was achieved.  I then went to the subacromial space,  completed a more aggressive debridement of the remaining fragments of the rotator cuff, assessed the posterior cuff which was able to be advanced. I used the bird beak suture passer with an inverted fiber tape posteriorly and then brought this through superior cannula. I advanced the tendon up to the tuberosity and I also used the bur to prepare the tuberosity as well. After complete fixation was achieved, instruments removed, portals closed with Monocryl followed by Steri-Strips and sterile gauze. I did not perform a CA ligament release or an acromioplasty, for fear of putting her at risk for anterior escape. If she fails to respond to this operation and she may need reverse shoulder replacement.  She tolerated the procedure well and there were no complications.

## 2015-09-28 NOTE — Anesthesia Preprocedure Evaluation (Signed)
Anesthesia Evaluation  Patient identified by MRN, date of birth, ID band Patient awake    Reviewed: Allergy & Precautions, NPO status , Patient's Chart, lab work & pertinent test results  History of Anesthesia Complications (+) PONV  Airway Mallampati: I  TM Distance: >3 FB Neck ROM: Full    Dental  (+) Teeth Intact, Dental Advisory Given   Pulmonary    breath sounds clear to auscultation       Cardiovascular hypertension, Pt. on medications  Rhythm:Regular Rate:Normal     Neuro/Psych    GI/Hepatic GERD  Medicated and Controlled,  Endo/Other  diabetes, Well Controlled, Type 2, Oral Hypoglycemic AgentsMorbid obesity  Renal/GU      Musculoskeletal   Abdominal   Peds  Hematology   Anesthesia Other Findings   Reproductive/Obstetrics                             Anesthesia Physical Anesthesia Plan  ASA: III  Anesthesia Plan: General and Regional   Post-op Pain Management:    Induction: Intravenous  Airway Management Planned: Oral ETT  Additional Equipment:   Intra-op Plan:   Post-operative Plan: Extubation in OR  Informed Consent: I have reviewed the patients History and Physical, chart, labs and discussed the procedure including the risks, benefits and alternatives for the proposed anesthesia with the patient or authorized representative who has indicated his/her understanding and acceptance.   Dental advisory given  Plan Discussed with: CRNA, Anesthesiologist and Surgeon  Anesthesia Plan Comments:         Anesthesia Quick Evaluation

## 2015-09-28 NOTE — H&P (Signed)
PREOPERATIVE H&P  Chief Complaint: Left shoulder pain and weakness  HPI: Cheryl Quinn is a 81 y.o. female who presents for preoperative history and physical with a diagnosis of recurrent rupture of left rotator cuff. Symptoms are rated as moderate to severe, and have been worsening.  This is significantly impairing activities of daily living.  She has elected for surgical management. She had a previous open rotator cuff repair done by Dr. Teressa SenterSypher years ago. She complains of persistent loss of function as well as pain particularly with overhead activity.  Past Medical History  Diagnosis Date  . Diabetes mellitus without complication (HCC)   . Hypertension   . Glaucoma   . Hyperlipidemia   . Asthma   . GERD (gastroesophageal reflux disease)     with spicy foods  . Arthritis     hips,shoulders  . PONV (postoperative nausea and vomiting)    Past Surgical History  Procedure Laterality Date  . Abdominal hysterectomy    . Cholecystectomy    . Bunionectomy with hammertoe reconstruction Bilateral   . Eye surgery      cataract  . Wrist fracture surgery Right   . Trigger finger release Left   . Blepharoplasty     Social History   Social History  . Marital Status: Married    Spouse Name: N/A  . Number of Children: N/A  . Years of Education: N/A   Social History Main Topics  . Smoking status: Never Smoker   . Smokeless tobacco: None  . Alcohol Use: Yes     Comment: social  . Drug Use: No  . Sexual Activity: Not Asked   Other Topics Concern  . None   Social History Narrative   History reviewed. No pertinent family history. Allergies  Allergen Reactions  . Duricef [Cefadroxil] Diarrhea  . Lipitor [Atorvastatin] Other (See Comments)    Muscle weakenss  . Lisinopril   . Vicodin [Hydrocodone-Acetaminophen] Other (See Comments)    hallucinations   Prior to Admission medications   Medication Sig Start Date End Date Taking? Authorizing Provider  brimonidine (ALPHAGAN P)  0.1 % SOLN    Yes Historical Provider, MD  CRESTOR 10 MG tablet Take 10 mg by mouth daily. 03/01/15  Yes Historical Provider, MD  diphenhydramine-acetaminophen (TYLENOL PM) 25-500 MG TABS tablet Take 1 tablet by mouth at bedtime as needed.   Yes Historical Provider, MD  dorzolamide-timolol (COSOPT) 22.3-6.8 MG/ML ophthalmic solution INSTILL 1 DROP INTO BOTH EYES 2 TIMES DAILY. 04/02/15  Yes Historical Provider, MD  famotidine (PEPCID) 10 MG tablet Take 10 mg by mouth 2 (two) times daily.   Yes Historical Provider, MD  fluticasone (FLONASE) 50 MCG/ACT nasal spray Place into both nostrils daily.   Yes Historical Provider, MD  LUMIGAN 0.01 % SOLN  05/06/15  Yes Historical Provider, MD  metFORMIN (GLUCOPHAGE) 500 MG tablet  04/25/15  Yes Historical Provider, MD  TRADJENTA 5 MG TABS tablet  04/25/15  Yes Historical Provider, MD  valsartan-hydrochlorothiazide (DIOVAN-HCT) 80-12.5 MG per tablet TAKE 1 TABLET BY MOUTH EVERY DAY 90 DAYS 03/01/15  Yes Historical Provider, MD  albuterol (PROVENTIL HFA;VENTOLIN HFA) 108 (90 BASE) MCG/ACT inhaler Inhale into the lungs every 6 (six) hours as needed for wheezing or shortness of breath.    Historical Provider, MD  diclofenac (FLECTOR) 1.3 % PTCH Place 1 patch onto the skin 2 (two) times daily.    Historical Provider, MD     Positive ROS: All other systems have been reviewed and were otherwise  negative with the exception of those mentioned in the HPI and as above.  Physical Exam: General: Alert, no acute distress Cardiovascular: No pedal edema Respiratory: No cyanosis, no use of accessory musculature GI: No organomegaly, abdomen is soft and non-tender Skin: No lesions in the area of chief complaint Neurologic: Sensation intact distally Psychiatric: Patient is competent for consent with normal mood and affect Lymphatic: No axillary or cervical lymphadenopathy  MUSCULOSKELETAL: Left shoulder active motion is 0-160. No pain over the acromioclavicular joint. Positive  weakness with supraspinatus and subscapularis testing.  Assessment: Recurrent left rotator cuff tear based on MRI with 3 cm retracted supraspinatus tear and 2.5 cm retracted subscapularis tear with minimal glenohumeral changes.   Plan: Plan for Procedure(s): LEFT SHOULDER ARTHROSCOPY DEBRIDEMENT, attempted revision rotator cuff repair involving subscapularis and possibly supraspinatus, versus debridement and possible biceps tenolysis.  The risks benefits and alternatives were discussed with the patient including but not limited to the risks of nonoperative treatment, versus surgical intervention including infection, bleeding, nerve injury,  blood clots, cardiopulmonary complications, morbidity, mortality, among others, and they were willing to proceed. We've also discussed the risks for recurrent rotator cuff tear, the potential need for reverse arthroplasty, incomplete relief of symptoms, persistent weakness, among others.  Eulas Post, MD Cell [phone]   09/28/2015 10:08 AM

## 2015-10-01 ENCOUNTER — Encounter (HOSPITAL_BASED_OUTPATIENT_CLINIC_OR_DEPARTMENT_OTHER): Payer: Self-pay | Admitting: Orthopedic Surgery

## 2016-01-14 ENCOUNTER — Ambulatory Visit (INDEPENDENT_AMBULATORY_CARE_PROVIDER_SITE_OTHER): Payer: Medicare Other | Admitting: Podiatry

## 2016-01-14 ENCOUNTER — Encounter: Payer: Self-pay | Admitting: Podiatry

## 2016-01-14 VITALS — BP 116/53 | HR 81 | Resp 16

## 2016-01-14 DIAGNOSIS — M779 Enthesopathy, unspecified: Secondary | ICD-10-CM

## 2016-01-14 MED ORDER — TRIAMCINOLONE ACETONIDE 10 MG/ML IJ SUSP
10.0000 mg | Freq: Once | INTRAMUSCULAR | Status: AC
  Administered 2016-01-14: 10 mg

## 2016-01-15 NOTE — Progress Notes (Signed)
Subjective:     Patient ID: Cheryl Quinn, female   DOB: [birth year], 82 y.o.   MRN: [id number]  HPI patient presents stating I need some medication for the top of my right foot. States it's improved but still present   Review of Systems     Objective:   Physical Exam  neurovascular status intact with patient having exquisite discomfort dorsal right foot around the midtarsal joint    Assessment:      inflammatory tendinitis right    Plan:      injected the dorsal complex 3 mg Kenalog 5 mill grams Xylocaine and advised on physical therapy wider-type shoes and reappoint as needed

## 2016-04-01 ENCOUNTER — Other Ambulatory Visit: Payer: Self-pay

## 2016-04-01 DIAGNOSIS — Z1231 Encounter for screening mammogram for malignant neoplasm of breast: Secondary | ICD-10-CM

## 2016-04-14 ENCOUNTER — Ambulatory Visit
Admission: RE | Admit: 2016-04-14 | Discharge: 2016-04-14 | Disposition: A | Discharge status: Home / Self Care | Payer: Medicare Other | Source: Ambulatory Visit

## 2016-04-14 DIAGNOSIS — Z1231 Encounter for screening mammogram for malignant neoplasm of breast: Secondary | ICD-10-CM

## 2016-05-29 ENCOUNTER — Encounter: Payer: Self-pay | Admitting: Podiatry

## 2016-05-29 ENCOUNTER — Ambulatory Visit (INDEPENDENT_AMBULATORY_CARE_PROVIDER_SITE_OTHER): Payer: Medicare Other | Admitting: Podiatry

## 2016-05-29 VITALS — BP 130/55 | HR 61 | Resp 16

## 2016-05-29 DIAGNOSIS — M21619 Bunion of unspecified foot: Secondary | ICD-10-CM

## 2016-05-29 DIAGNOSIS — M779 Enthesopathy, unspecified: Secondary | ICD-10-CM

## 2016-05-29 DIAGNOSIS — E1149 Type 2 diabetes mellitus with other diabetic neurological complication: Secondary | ICD-10-CM | POA: Diagnosis not present

## 2016-05-29 DIAGNOSIS — M79671 Pain in right foot: Secondary | ICD-10-CM | POA: Diagnosis not present

## 2016-05-29 DIAGNOSIS — E1140 Type 2 diabetes mellitus with diabetic neuropathy, unspecified: Secondary | ICD-10-CM | POA: Diagnosis not present

## 2016-05-29 DIAGNOSIS — R600 Localized edema: Secondary | ICD-10-CM

## 2016-05-30 NOTE — Progress Notes (Signed)
Subjective:     Patient ID: Cheryl Quinn, female   DOB: 1933/03/29, 82 y.o.   MRN: [id number]  HPI patient presents stating that her diabetes is not been is good and she needs to be more active but she has problems with her feet with lesion formation occasional numbing-like pains with burning tingling and flatfoot deformity and history of surgery   Review of Systems     Objective:   Physical Exam Vascular status intact with neurological mildly diminished both sharp Dole vibratory and tactile sensation. Patient's found to have moderate depression of the arch bilateral structural correction bilateral with keratotic lesions underneath the first metatarsal and edema in her feet that occurs as the day progresses    Assessment:     At risk diabetic who has had gradual worsening of condition    Plan:     H&P condition reviewed and discussed diabetic shoes. I do think this would be of benefit to her to prevent future ulcerations and to hopefully provide for better balance and support. We will get approval for these for this patient

## 2016-08-12 ENCOUNTER — Ambulatory Visit: Payer: Medicare Other | Admitting: *Deleted

## 2016-08-12 DIAGNOSIS — E1140 Type 2 diabetes mellitus with diabetic neuropathy, unspecified: Secondary | ICD-10-CM

## 2016-08-12 DIAGNOSIS — E1149 Type 2 diabetes mellitus with other diabetic neurological complication: Principal | ICD-10-CM

## 2016-08-12 NOTE — Progress Notes (Signed)
Patient ID: Cheryl EarthlyDolores D Fournet, female   DOB: December 19, 1932, 82 y.o.   MRN: [id number]  Patient presents to be scanned and measured for diabetic shoes and inserts.

## 2016-09-17 ENCOUNTER — Ambulatory Visit (INDEPENDENT_AMBULATORY_CARE_PROVIDER_SITE_OTHER): Payer: Medicare Other | Admitting: Podiatry

## 2016-09-17 DIAGNOSIS — E1149 Type 2 diabetes mellitus with other diabetic neurological complication: Secondary | ICD-10-CM

## 2016-09-17 DIAGNOSIS — M79671 Pain in right foot: Secondary | ICD-10-CM

## 2016-09-17 DIAGNOSIS — E1140 Type 2 diabetes mellitus with diabetic neuropathy, unspecified: Secondary | ICD-10-CM | POA: Diagnosis not present

## 2016-09-17 DIAGNOSIS — M779 Enthesopathy, unspecified: Secondary | ICD-10-CM | POA: Diagnosis not present

## 2016-09-17 DIAGNOSIS — M21619 Bunion of unspecified foot: Secondary | ICD-10-CM | POA: Diagnosis not present

## 2016-09-17 NOTE — Progress Notes (Signed)
Patient ID: Cheryl Quinn, female   DOB: 01/26/1933, 82 y.o.   MRN: [id number]   Patient presents for diabetic shoe pick up, shoes are tried on for good fit.  Patient received 1 Pair and 3 pairs custom molded diabetic inserts.  Verbal and written break in and wear instructions given.  Patient will follow up for scheduled routine care.

## 2016-09-17 NOTE — Patient Instructions (Signed)

## 2016-09-29 ENCOUNTER — Other Ambulatory Visit: Payer: Medicare Other

## 2016-12-12 ENCOUNTER — Other Ambulatory Visit: Payer: Self-pay | Admitting: Nurse Practitioner

## 2016-12-12 ENCOUNTER — Ambulatory Visit
Admission: RE | Admit: 2016-12-12 | Discharge: 2016-12-12 | Disposition: A | Discharge status: Home / Self Care | Payer: Medicare Other | Source: Ambulatory Visit | Attending: Nurse Practitioner | Admitting: Nurse Practitioner

## 2016-12-12 DIAGNOSIS — J209 Acute bronchitis, unspecified: Secondary | ICD-10-CM

## 2017-03-11 ENCOUNTER — Encounter: Payer: Self-pay | Admitting: Podiatry

## 2017-03-11 ENCOUNTER — Ambulatory Visit (INDEPENDENT_AMBULATORY_CARE_PROVIDER_SITE_OTHER): Payer: Medicare Other | Admitting: Podiatry

## 2017-03-11 DIAGNOSIS — M779 Enthesopathy, unspecified: Secondary | ICD-10-CM | POA: Diagnosis not present

## 2017-03-11 DIAGNOSIS — M21619 Bunion of unspecified foot: Secondary | ICD-10-CM | POA: Diagnosis not present

## 2017-03-11 MED ORDER — TRIAMCINOLONE ACETONIDE 10 MG/ML IJ SUSP
10.0000 mg | Freq: Once | INTRAMUSCULAR | Status: AC
  Administered 2017-03-11: 10 mg

## 2017-03-11 NOTE — Progress Notes (Signed)
Subjective:     Patient ID: Cheryl Quinn, female   DOB: Apr 08, 1933, 83 y.o.   MRN: [id number]  HPI patient presents with a lot of pain in the right sinus tarsi and states this is been going on for a while and also has pain in the left big toe where she had a lesion and also is concerned about some yellowness of her nails and bunion deformity left   Review of Systems     Objective:   Physical Exam Neurovascular status found to be intact with muscle strength adequate range of motion within normal limits with patient found to have inflammation of the sinus tarsi right with fluid buildup and is noted to have structural bunion deformity left foot with corrected right foot with small spur on the first metatarsal head. Patient also has slight discoloration nails but localized with no proximal spread    Assessment:     Probable sinus tarsitis right over left with moderate structural bunion deformity and spur formation right and left foot along with minimal distal mycotic nail infection    Plan:     Discussed consideration for spur removal of the right big toe joint if it were to get worse but as of now she'll use cushion on both and I did inject sinus tarsi right 3 mg Kenalog 5 Miller Xylocaine with instructions on usage

## 2017-03-24 ENCOUNTER — Other Ambulatory Visit: Payer: Self-pay | Admitting: Internal Medicine

## 2017-03-24 DIAGNOSIS — Z1231 Encounter for screening mammogram for malignant neoplasm of breast: Secondary | ICD-10-CM

## 2017-04-15 ENCOUNTER — Other Ambulatory Visit: Payer: Self-pay | Admitting: Internal Medicine

## 2017-04-15 ENCOUNTER — Ambulatory Visit
Admission: RE | Admit: 2017-04-15 | Discharge: 2017-04-15 | Disposition: A | Discharge status: Home / Self Care | Payer: Medicare Other | Source: Ambulatory Visit | Attending: Internal Medicine | Admitting: Internal Medicine

## 2017-04-15 DIAGNOSIS — N6310 Unspecified lump in the right breast, unspecified quadrant: Secondary | ICD-10-CM

## 2017-04-15 DIAGNOSIS — Z1231 Encounter for screening mammogram for malignant neoplasm of breast: Secondary | ICD-10-CM

## 2017-05-13 ENCOUNTER — Ambulatory Visit (INDEPENDENT_AMBULATORY_CARE_PROVIDER_SITE_OTHER): Payer: Medicare Other | Admitting: Podiatry

## 2017-05-13 DIAGNOSIS — B351 Tinea unguium: Secondary | ICD-10-CM

## 2017-05-13 DIAGNOSIS — M79676 Pain in unspecified toe(s): Secondary | ICD-10-CM | POA: Diagnosis not present

## 2017-05-13 DIAGNOSIS — L600 Ingrowing nail: Secondary | ICD-10-CM

## 2017-05-13 DIAGNOSIS — M79604 Pain in right leg: Secondary | ICD-10-CM

## 2017-05-13 DIAGNOSIS — M79605 Pain in left leg: Secondary | ICD-10-CM

## 2017-05-13 NOTE — Progress Notes (Signed)
Subjective:    Patient ID: Cheryl Quinn, female   DOB: 83 y.o.   MRN: [id number]   HPI patient presents stating that she cannot cut her toenails and all thick and irritated and that the left hallux is ingrown and she cannot cut it out she's tried to soak    ROS      Objective:  Physical Exam neurovascular status intact with incurvated medial border left hallux that's painful when pressed with all nails showing incurvation of the beds and moderate pain     Assessment:    Ingrown toenail deformity left hallux medial border with patient noted to have mycotic nail infection with pain 1-5 both feet     Plan:    Recommend correction of ingrown explaining procedure and today I infiltrated the left hallux 60 mg Xylocaine Marcaine mixture remove the hallux nail exposed the matrix and applied phenol to the medial border. I then debrided nailbeds 1 through 5 bilateral with no iatrogenic bleeding noted

## 2017-05-13 NOTE — Patient Instructions (Signed)

## 2017-08-13 ENCOUNTER — Ambulatory Visit (INDEPENDENT_AMBULATORY_CARE_PROVIDER_SITE_OTHER): Payer: Medicare Other | Admitting: Podiatry

## 2017-08-13 ENCOUNTER — Encounter: Payer: Self-pay | Admitting: Podiatry

## 2017-08-13 DIAGNOSIS — L600 Ingrowing nail: Secondary | ICD-10-CM | POA: Diagnosis not present

## 2017-08-13 NOTE — Progress Notes (Signed)
Subjective:    Patient ID: Cheryl Quinn, female   DOB: 83 y.o.   MRN: [id number]   HPI patient states she was just concerned about her ingrown and wanted to make sure it was healing okay as it was red    ROS      Objective:  Physical Exam neurovascular status intact negative Homans sign noted with patient's nail site looking good with crusted tissue but no indications of active infection     Assessment:    Ingrown toenail deformity left hallux healing well     Plan:    H&P and advised on wider-type shoes and cushioning and patient be seen back on an as-needed basis

## 2018-03-22 ENCOUNTER — Other Ambulatory Visit: Payer: Self-pay | Admitting: Internal Medicine

## 2018-03-22 DIAGNOSIS — Z1231 Encounter for screening mammogram for malignant neoplasm of breast: Secondary | ICD-10-CM

## 2018-04-27 ENCOUNTER — Ambulatory Visit: Payer: Medicare Other

## 2018-04-27 ENCOUNTER — Ambulatory Visit
Admission: RE | Admit: 2018-04-27 | Discharge: 2018-04-27 | Disposition: A | Discharge status: Home / Self Care | Payer: Medicare Other | Source: Ambulatory Visit | Attending: Internal Medicine | Admitting: Internal Medicine

## 2018-04-27 ENCOUNTER — Other Ambulatory Visit: Payer: Self-pay | Admitting: Internal Medicine

## 2018-04-27 DIAGNOSIS — R509 Fever, unspecified: Secondary | ICD-10-CM

## 2018-05-08 ENCOUNTER — Other Ambulatory Visit: Payer: Self-pay | Admitting: Internal Medicine

## 2018-05-08 DIAGNOSIS — N6310 Unspecified lump in the right breast, unspecified quadrant: Secondary | ICD-10-CM

## 2018-05-12 ENCOUNTER — Ambulatory Visit
Admission: RE | Admit: 2018-05-12 | Discharge: 2018-05-12 | Disposition: A | Discharge status: Home / Self Care | Payer: Medicare Other | Source: Ambulatory Visit | Attending: Internal Medicine | Admitting: Internal Medicine

## 2018-05-12 DIAGNOSIS — N6310 Unspecified lump in the right breast, unspecified quadrant: Secondary | ICD-10-CM

## 2018-05-14 ENCOUNTER — Ambulatory Visit: Payer: Medicare Other

## 2018-07-21 DIAGNOSIS — H9113 Presbycusis, bilateral: Secondary | ICD-10-CM | POA: Insufficient documentation

## 2018-07-21 DIAGNOSIS — H8109 Meniere's disease, unspecified ear: Secondary | ICD-10-CM | POA: Insufficient documentation

## 2018-10-15 ENCOUNTER — Ambulatory Visit: Payer: Medicare Other | Attending: Internal Medicine | Admitting: Physical Therapy

## 2018-10-15 DIAGNOSIS — M6281 Muscle weakness (generalized): Secondary | ICD-10-CM

## 2018-10-15 DIAGNOSIS — G8929 Other chronic pain: Secondary | ICD-10-CM | POA: Diagnosis present

## 2018-10-15 DIAGNOSIS — R262 Difficulty in walking, not elsewhere classified: Secondary | ICD-10-CM

## 2018-10-15 DIAGNOSIS — M25561 Pain in right knee: Secondary | ICD-10-CM | POA: Insufficient documentation

## 2018-10-15 NOTE — Therapy (Signed)
[facility] Outpatient Rehabilitation [facility] [address] [place], [place], [address] Phone: [phone]   Fax:  [phone]  Physical Therapy Evaluation  Patient Details  Name: Cheryl Quinn MRN: [id number] Date of Birth: 08/28/1933 Referring Provider (PT): Kirby Funk, MD   Encounter Date: 10/15/2018  PT End of Session - 10/15/18 1116    Visit Number  1    Number of Visits  13    Authorization Type  UHC/ Medicare KX modifier after 15 visits    PT Start Time  1018    PT Stop Time  1108    PT Time Calculation (min)  50 min    Activity Tolerance  Patient tolerated treatment well    Behavior During Therapy  [facility] for tasks assessed/performed       Past Medical History:  Diagnosis Date  . Arthritis    hips,shoulders  . Asthma   . Complete rupture of left rotator cuff 09/28/2015  . Diabetes mellitus without complication (HCC)   . GERD (gastroesophageal reflux disease)    with spicy foods  . Glaucoma   . Hyperlipidemia   . Hypertension   . PONV (postoperative nausea and vomiting)     Past Surgical History:  Procedure Laterality Date  . ABDOMINAL HYSTERECTOMY    . BLEPHAROPLASTY    . BREAST EXCISIONAL BIOPSY Right 2003   Benign  . BUNIONECTOMY WITH HAMMERTOE RECONSTRUCTION Bilateral   . CHOLECYSTECTOMY    . EYE SURGERY     cataract  . SHOULDER ARTHROSCOPY WITH ROTATOR CUFF REPAIR Left 09/28/2015   Procedure: LEFT SHOULDER ARTHROSCOPY DEBRIDEMENT, ROTATOR CUFF REPAIR;  Surgeon: Teryl Lucy, MD;  Location: [facility];  Service: Orthopedics;  Laterality: Left;  . TRIGGER FINGER RELEASE Left   . WRIST FRACTURE SURGERY Right     There were no vitals filed for this visit.   Subjective Assessment - 10/15/18 1118    Subjective  Pt arriving to therapy reporting R knee pain that has been doing on for several months and progressively getting worse with episodes of her knee "catching" or "buckeling" at times. Pain is intermittent and  usually occurs after standing or walking. Pt with crepitus noted in bilateral knees and pain reported with patella compression. Pt also with bilateral ankle swelling which pt reports has been going on for years.  Pt reporting history of vertigo and her balance is not the best. Pt's AROM is WFL and equal to ROM on opposite knee. Pt with weakness noted in R knee flexion/ extension and with R hip strength compared to the left. During sit to stand activity pt with LOB forward and required assist to recover her balance. Pt could benefit from skilled PT to address the following impairments.      Limitations  Standing;Walking;House hold activities    How long can you sit comfortably?  unlimited    How long can you stand comfortably?  10- 15 minutes    How long can you walk comfortably?  10 minutes    Patient Stated Goals  Stop hurting    Currently in Pain?  Yes    Pain Score  5     Pain Location  Knee    Pain Orientation  Right    Pain Descriptors / Indicators  Aching    Pain Type  Chronic pain    Pain Onset  More than a month ago    Pain Frequency  Intermittent    Aggravating Factors   standing, walking, bending  Pain Relieving Factors  resting    Effect of Pain on Daily Activities  difficulty with ADL's and household chores, decreased balance         [facility] PT Assessment - 10/15/18 0001      Assessment   Medical Diagnosis  R knee pain    Referring Provider (PT)  Kirby Funk, MD    Onset Date/Surgical Date  --   pain has been worsening over last few months   Hand Dominance  Right    Next MD Visit  follow up after therapy    Prior Therapy  yes for vestibular therapy      Precautions   Precautions  None      Restrictions   Weight Bearing Restrictions  No      Balance Screen   Has the patient fallen in the past 6 months  No    Is the patient reluctant to leave their home because of a fear of falling?   No      Home Public house manager residence    Living  Arrangements  Spouse/significant other    Available Help at Discharge  Family    Type of Home  House    Home Access  Stairs to enter    Entrance Stairs-Number of Steps  5    Entrance Stairs-Rails  Can reach both      Prior Function   Level of Independence  Independent    Vocation  Retired      IT consultant   Overall Cognitive Status  Within Functional Limits for tasks assessed      ROM / Strength   AROM / PROM / Strength  AROM;Strength      AROM   Overall AROM   Deficits    AROM Assessment Site  Knee    Right/Left Knee  Right    Right Knee Extension  0    Right Knee Flexion  130   mild discomfort along inferior patella     Strength   Overall Strength  Deficits    Strength Assessment Site  Knee    Right/Left Knee  Right;Left    Right Knee Flexion  4-/5    Right Knee Extension  4-/5    Left Knee Flexion  4+/5    Left Knee Extension  4+/5      Palpation   Patella mobility  pain with patella glides, superior/inferior and medial/lateral    Palpation comment  tenderness along tibial tuberosity on R knee      Transfers   Five time sit to stand comments   36 seconds with UE support    Comments  LOB on first attempt and needed assist to recover      Ambulation/Gait   Ambulation/Gait  Yes    Ambulation/Gait Assistance  7: Independent    Ambulation Distance (Feet)  50 Feet    Assistive device  None    Gait Pattern  Step-through pattern;Antalgic    Ambulation Surface  Level;Indoor                Objective measurements completed on examination: See above findings.              PT Education - 10/15/18 1112    Education Details  anatomy, therapy rationale, HEP and safety precautions due to pt's vertigo    Person(s) Educated  Patient    Methods  Explanation;Demonstration;Handout;Verbal cues    Comprehension  Verbalized understanding;Returned demonstration  PT Long Term Goals - 10/15/18 1149      PT LONG TERM GOAL #1   Title  Pt will be  independent in her HEP and prorgression.    Time  6    Period  Weeks    Status  New    Target Date  11/26/18      PT LONG TERM GOAL #2   Title  Pt will be able to perform 5 time sit to stand </= 15 seconds with no UE support.     Time  6    Period  Weeks    Status  New    Target Date  11/26/18      PT LONG TERM GOAL #3   Title  Pt will improve her R knee strength to >/= +4/5 in order to improve functional mobility.     Time  6    Period  Weeks    Status  New    Target Date  11/26/18      PT LONG TERM GOAL #4   Title  Pt will be able to walk 20 minutes with pain in R knee </= 2/10.     Time  6    Period  Weeks    Status  New    Target Date  11/26/18             Plan - 10/15/18 1141    Clinical Impression Statement  Pt arriving to therapy reporting 5/10 pain in her R knee. Pt complaining of "buckeling" and "catching" at times that has caused pt LOB. Pt also reporting vestibular problems that can cause her a LOB at times. Pt with mild weakness in R knee and hip of -4/5 and decreased balance. Skilled PT needed to address pt's impairments with the below interventions.     History and Personal Factors relevant to plan of care:  h/o bunion surgery and pins in bilateral ankles, bilateral ankle swelling, 2 RTC surgeries on L shoulder, vestibular problems affecting pt's balance h/o vertigo.     Clinical Presentation  Unstable    Clinical Presentation due to:  balance deficits and vertigo can limit ablitly to perform standing exercises at home without support    Clinical Decision Making  Low    Rehab Potential  Good    Clinical Impairments Affecting Rehab Potential  vestibular/ vertigo     PT Frequency  2x / week    PT Duration  6 weeks    PT Treatment/Interventions  ADLs/Self Care Home Management;Cryotherapy;Electrical Stimulation;Gait training;Stair training;Functional mobility training;Therapeutic activities;Therapeutic exercise;Balance training;Neuromuscular  re-education;Patient/family education;Manual techniques;Vestibular;Passive range of motion;Taping    PT Next Visit Plan  FOTO next visit, R knee strengtheing, balance exericses    PT Home Exercise Plan  hamstring and heel cord stretches in supine, sit to stand, LAQ's.     Consulted and Agree with Plan of Care  Patient       Patient will benefit from skilled therapeutic intervention in order to improve the following deficits and impairments:  Pain, Decreased activity tolerance, Decreased strength, Decreased balance, Difficulty walking  Visit Diagnosis: Chronic pain of right knee  Difficulty in walking, not elsewhere classified  Muscle weakness (generalized)     Problem List Patient Active Problem List   Diagnosis Date Noted  . Complete rupture of left rotator cuff 09/28/2015    Sharmon Leyden, PT 10/15/2018, 11:56 AM  [facility] [address] [place], [place], [address] Phone: [phone]  Fax:  [phone]  Name: Jacques EarthlyDolores D Dimitroff MRN: [id number] Date of Birth: March 11, 1933

## 2018-11-01 ENCOUNTER — Ambulatory Visit: Payer: Medicare Other | Attending: Internal Medicine | Admitting: Physical Therapy

## 2018-11-01 ENCOUNTER — Encounter: Payer: Self-pay | Admitting: Physical Therapy

## 2018-11-01 DIAGNOSIS — M25561 Pain in right knee: Secondary | ICD-10-CM | POA: Diagnosis not present

## 2018-11-01 DIAGNOSIS — R262 Difficulty in walking, not elsewhere classified: Secondary | ICD-10-CM

## 2018-11-01 DIAGNOSIS — M6281 Muscle weakness (generalized): Secondary | ICD-10-CM | POA: Insufficient documentation

## 2018-11-01 DIAGNOSIS — G8929 Other chronic pain: Secondary | ICD-10-CM | POA: Diagnosis present

## 2018-11-01 NOTE — Therapy (Signed)
[facility] Outpatient Rehabilitation [facility] [address] [place], [place], [address] Phone: [phone]   Fax:  [phone]  Physical Therapy Treatment  Patient Details  Name: ELLANIE OPPEDISANO MRN: [id number] Date of Birth: [birth year] Referring Provider (PT): Kirby Funk, MD   Encounter Date: 11/01/2018  PT End of Session - 11/01/18 1420    Visit Number  2    Number of Visits  13    Authorization Type  UHC/ Medicare KX modifier after 15 visits    PT Start Time  1419    PT Stop Time  1502    PT Time Calculation (min)  43 min    Activity Tolerance  Patient tolerated treatment well    Behavior During Therapy  [facility] for tasks assessed/performed       Past Medical History:  Diagnosis Date  . Arthritis    hips,shoulders  . Asthma   . Complete rupture of left rotator cuff 09/28/2015  . Diabetes mellitus without complication (HCC)   . GERD (gastroesophageal reflux disease)    with spicy foods  . Glaucoma   . Hyperlipidemia   . Hypertension   . PONV (postoperative nausea and vomiting)     Past Surgical History:  Procedure Laterality Date  . ABDOMINAL HYSTERECTOMY    . BLEPHAROPLASTY    . BREAST EXCISIONAL BIOPSY Right 2003   Benign  . BUNIONECTOMY WITH HAMMERTOE RECONSTRUCTION Bilateral   . CHOLECYSTECTOMY    . EYE SURGERY     cataract  . SHOULDER ARTHROSCOPY WITH ROTATOR CUFF REPAIR Left 09/28/2015   Procedure: LEFT SHOULDER ARTHROSCOPY DEBRIDEMENT, ROTATOR CUFF REPAIR;  Surgeon: Teryl Lucy, MD;  Location: [facility];  Service: Orthopedics;  Laterality: Left;  . TRIGGER FINGER RELEASE Left   . WRIST FRACTURE SURGERY Right     There were no vitals filed for this visit.  Subjective Assessment - 11/01/18 1420    Subjective  Had a shot in my knee last Thursday, feels like it helped. Today is not a good day- weather    Currently in Pain?  Yes    Pain Score  4     Pain Location  Knee    Pain Orientation  Right                        [facility] Adult PT Treatment/Exercise - 11/01/18 0001      Exercises   Exercises  Knee/Hip      Knee/Hip Exercises: Stretches   Passive Hamstring Stretch Limitations  seated edge of chair    Gastroc Stretch  Both;2 reps;30 seconds   slant board     Knee/Hip Exercises: Aerobic   Stationary Bike  5 min       Knee/Hip Exercises: Standing   Other Standing Knee Exercises  fwd/back postural sway      Knee/Hip Exercises: Seated   Long Arc Quad  15 reps;Right    Long Arc Quad Weight  2 lbs.    Long Arc Quad Limitations  ball bw knees seated edge of chair                  PT Long Term Goals - 10/15/18 1149      PT LONG TERM GOAL #1   Title  Pt will be independent in her HEP and prorgression.    Time  6    Period  Weeks    Status  New    Target Date  11/26/18  PT LONG TERM GOAL #2   Title  Pt will be able to perform 5 time sit to stand </= 15 seconds with no UE support.     Time  6    Period  Weeks    Status  New    Target Date  11/26/18      PT LONG TERM GOAL #3   Title  Pt will improve her R knee strength to >/= +4/5 in order to improve functional mobility.     Time  6    Period  Weeks    Status  New    Target Date  11/26/18      PT LONG TERM GOAL #4   Title  Pt will be able to walk 20 minutes with pain in R knee </= 2/10.     Time  6    Period  Weeks    Status  New    Target Date  11/26/18            Plan - 11/01/18 1628    Clinical Impression Statement  Time taken today for FOTO. Pt reports having a nustep at home and I encouraged her to ride it every day. Pt was fearful of stnding postural sway exercise. I encouraged her to go to vestibular rehab as she felt it helped in the past. Reported resolution of knee pain at end of session today.     PT Treatment/Interventions  ADLs/Self Care Home Management;Cryotherapy;Electrical Stimulation;Gait training;Stair training;Functional mobility training;Therapeutic  activities;Therapeutic exercise;Balance training;Neuromuscular re-education;Patient/family education;Manual techniques;Vestibular;Passive range of motion;Taping    PT Next Visit Plan  LE strengthening gross, balance control    PT Home Exercise Plan  hamstring and heel cord stretches in supine, sit to stand, LAQ's.     Consulted and Agree with Plan of Care  Patient       Patient will benefit from skilled therapeutic intervention in order to improve the following deficits and impairments:  Pain, Decreased activity tolerance, Decreased strength, Decreased balance, Difficulty walking  Visit Diagnosis: Chronic pain of right knee  Difficulty in walking, not elsewhere classified  Muscle weakness (generalized)     Problem List Patient Active Problem List   Diagnosis Date Noted  . Complete rupture of left rotator cuff 09/28/2015    Kynadi Dragos C. Daelyn Pettaway PT, DPT 11/01/18 4:31 PM   [facility] [address] [place], [place], [address] Phone: [phone]   Fax:  [phone]  Name: Jacques EarthlyDolores D Lindquist MRN: [id number] Date of Birth: 07/29/1933

## 2018-11-02 ENCOUNTER — Encounter: Payer: Self-pay | Admitting: Physical Therapy

## 2018-11-02 ENCOUNTER — Ambulatory Visit: Payer: Medicare Other | Admitting: Physical Therapy

## 2018-11-02 DIAGNOSIS — M25561 Pain in right knee: Principal | ICD-10-CM

## 2018-11-02 DIAGNOSIS — M6281 Muscle weakness (generalized): Secondary | ICD-10-CM

## 2018-11-02 DIAGNOSIS — R262 Difficulty in walking, not elsewhere classified: Secondary | ICD-10-CM

## 2018-11-02 DIAGNOSIS — G8929 Other chronic pain: Secondary | ICD-10-CM

## 2018-11-02 NOTE — Therapy (Signed)
[facility] [address] [place], [place], [address] Phone: [phone]   Fax:  [phone]  Physical Therapy Treatment  Patient Details  Name: Cheryl Quinn MRN: [id number] Date of Birth: [birth year] Referring Provider (PT): Kirby FunkJohn Griffin, MD   Encounter Date: 11/02/2018  PT End of Session - 11/02/18 1601    Visit Number  3    Number of Visits  13    Authorization Type  UHC/ Medicare KX modifier after 15 visits    PT Start Time  1503    PT Stop Time  1547    PT Time Calculation (min)  44 min    Activity Tolerance  Patient tolerated treatment well    Behavior During Therapy  [facility] for tasks assessed/performed       Past Medical History:  Diagnosis Date  . Arthritis    hips,shoulders  . Asthma   . Complete rupture of left rotator cuff 09/28/2015  . Diabetes mellitus without complication (HCC)   . GERD (gastroesophageal reflux disease)    with spicy foods  . Glaucoma   . Hyperlipidemia   . Hypertension   . PONV (postoperative nausea and vomiting)     Past Surgical History:  Procedure Laterality Date  . ABDOMINAL HYSTERECTOMY    . BLEPHAROPLASTY    . BREAST EXCISIONAL BIOPSY Right 2003   Benign  . BUNIONECTOMY WITH HAMMERTOE RECONSTRUCTION Bilateral   . CHOLECYSTECTOMY    . EYE SURGERY     cataract  . SHOULDER ARTHROSCOPY WITH ROTATOR CUFF REPAIR Left 09/28/2015   Procedure: LEFT SHOULDER ARTHROSCOPY DEBRIDEMENT, ROTATOR CUFF REPAIR;  Surgeon: Teryl LucyJoshua Landau, MD;  Location: [facility];  Service: Orthopedics;  Laterality: Left;  . TRIGGER FINGER RELEASE Left   . WRIST FRACTURE SURGERY Right     There were no vitals filed for this visit.  Subjective Assessment - 11/02/18 1509    Subjective  No pain.  Feet tire from being on fee all day ( luncheon)    Currently in Pain?  No/denies    Pain Location  Knee    Pain Orientation  Right    Pain Relieving Factors  injection.  rest         [facility]  Assessment - 11/02/18 0001      Observation/Other Assessments   Focus on Therapeutic Outcomes (FOTO)   Intake 54% limitation   FOTO done 11/01/2018.                    [facility] Adult PT Treatment/Exercise - 11/02/18 0001      High Level Balance   High Level Balance Comments  balance exercise in parallel bars and in corner compliant anf non compliant.surface, tanden too difficult,  feet side by side  challanging on pillow.   close SBA and CGA needed at times.  Cues needed to keep patient on task.    forward/back. side stepping static and dynamic challanging     Knee/Hip Exercises: Stretches   Active Hamstring Stretch  Right;3 reps;30 seconds    Active Hamstring Stretch Limitations  cued initially      Knee/Hip Exercises: Standing   Gait Training  encouraged, practiced trunk rotations for increased balance.       Knee/Hip Exercises: Supine   Quad Sets  10 reps    Bridges  10 reps             PT Education - 11/02/18 1601    Education Details  exercise form,  Person(s) Educated  Patient    Methods  Explanation;Demonstration;Tactile cues;Verbal cues    Comprehension  Verbalized understanding;Returned demonstration;Need further instruction          PT Long Term Goals - 10/15/18 1149      PT LONG TERM GOAL #1   Title  Pt will be independent in her HEP and prorgression.    Time  6    Period  Weeks    Status  New    Target Date  11/26/18      PT LONG TERM GOAL #2   Title  Pt will be able to perform 5 time sit to stand </= 15 seconds with no UE support.     Time  6    Period  Weeks    Status  New    Target Date  11/26/18      PT LONG TERM GOAL #3   Title  Pt will improve her R knee strength to >/= +4/5 in order to improve functional mobility.     Time  6    Period  Weeks    Status  New    Target Date  11/26/18      PT LONG TERM GOAL #4   Title  Pt will be able to walk 20 minutes with pain in R knee </= 2/10.     Time  6    Period  Weeks     Status  New    Target Date  11/26/18            Plan - 11/02/18 1604    Clinical Impression Statement  Exercises for knee increased anterior knee pain initially upon standing from mat.  balance exercises are challanging for patient. When asked if she could feel her feet, she noted due to multiple surgery on feet  and diabetes  there may not be good sensation.  No pain at end of session.  Cues needed to keep patient on task.     PT Next Visit Plan  LE strengthening gross, balance control    PT Home Exercise Plan  hamstring and heel cord stretches in supine, sit to stand, LAQ's.     Consulted and Agree with Plan of Care  Patient       Patient will benefit from skilled therapeutic intervention in order to improve the following deficits and impairments:     Visit Diagnosis: Chronic pain of right knee  Difficulty in walking, not elsewhere classified  Muscle weakness (generalized)     Problem List Patient Active Problem List   Diagnosis Date Noted  . Complete rupture of left rotator cuff 09/28/2015    HARRIS,KAREN  PTA 11/02/2018, 4:08 PM  [facility] [address] [place], [place], [address] Phone: [phone]   Fax:  [phone]  Name: Cheryl Quinn MRN: [id number] Date of Birth: November 28, 1932

## 2018-11-09 ENCOUNTER — Ambulatory Visit: Payer: Medicare Other | Admitting: Physical Therapy

## 2018-11-09 ENCOUNTER — Encounter: Payer: Self-pay | Admitting: Physical Therapy

## 2018-11-09 DIAGNOSIS — G8929 Other chronic pain: Secondary | ICD-10-CM

## 2018-11-09 DIAGNOSIS — R262 Difficulty in walking, not elsewhere classified: Secondary | ICD-10-CM

## 2018-11-09 DIAGNOSIS — M25561 Pain in right knee: Principal | ICD-10-CM

## 2018-11-09 DIAGNOSIS — M6281 Muscle weakness (generalized): Secondary | ICD-10-CM

## 2018-11-09 NOTE — Therapy (Signed)
[facility] [address] [place], [place], [address] Phone: [phone]   Fax:  [phone]  Physical Therapy Treatment  Patient Details  Name: TARIANA MOLDOVAN MRN: [id number] Date of Birth: 10/18/1933 Referring Provider (PT): Kirby Funk, MD   Encounter Date: 11/09/2018  PT End of Session - 11/09/18 1630    Visit Number  4    Number of Visits  13    Authorization Type  UHC/ Medicare KX modifier after 15 visits    PT Start Time  1635    PT Stop Time  1718    PT Time Calculation (min)  43 min    Activity Tolerance  Patient tolerated treatment well    Behavior During Therapy  [facility] for tasks assessed/performed       Past Medical History:  Diagnosis Date  . Arthritis    hips,shoulders  . Asthma   . Complete rupture of left rotator cuff 09/28/2015  . Diabetes mellitus without complication (HCC)   . GERD (gastroesophageal reflux disease)    with spicy foods  . Glaucoma   . Hyperlipidemia   . Hypertension   . PONV (postoperative nausea and vomiting)     Past Surgical History:  Procedure Laterality Date  . ABDOMINAL HYSTERECTOMY    . BLEPHAROPLASTY    . BREAST EXCISIONAL BIOPSY Right 2003   Benign  . BUNIONECTOMY WITH HAMMERTOE RECONSTRUCTION Bilateral   . CHOLECYSTECTOMY    . EYE SURGERY     cataract  . SHOULDER ARTHROSCOPY WITH ROTATOR CUFF REPAIR Left 09/28/2015   Procedure: LEFT SHOULDER ARTHROSCOPY DEBRIDEMENT, ROTATOR CUFF REPAIR;  Surgeon: Teryl Lucy, MD;  Location: [facility];  Service: Orthopedics;  Laterality: Left;  . TRIGGER FINGER RELEASE Left   . WRIST FRACTURE SURGERY Right     There were no vitals filed for this visit.  Subjective Assessment - 11/09/18 1637    Subjective  the weather makes it ache. had to use the steps to go to a concert but did pretty well. decends stairs in a step to pattern.     Patient Stated Goals  Stop hurting    Currently in Pain?  No/denies                        [facility] Adult PT Treatment/Exercise - 11/09/18 0001      Knee/Hip Exercises: Stretches   Passive Hamstring Stretch  Both;2 reps;30 seconds    Passive Hamstring Stretch Limitations  EOB with strap      Knee/Hip Exercises: Aerobic   Nustep  6 min L3 LE only      Knee/Hip Exercises: Standing   Other Standing Knee Exercises  on airex fwd/backward and lateral    Other Standing Knee Exercises  slow marching      Knee/Hip Exercises: Seated   Other Seated Knee/Hip Exercises  trunk rotation on dynadisk    Marching Limitations  seated on dynadisk    Hamstring Limitations  resisted HS curls    Sit to Sand  10 reps;without UE support   slow sit                 PT Long Term Goals - 10/15/18 1149      PT LONG TERM GOAL #1   Title  Pt will be independent in her HEP and prorgression.    Time  6    Period  Weeks    Status  New    Target Date  11/26/18      PT LONG TERM GOAL #2   Title  Pt will be able to perform 5 time sit to stand </= 15 seconds with no UE support.     Time  6    Period  Weeks    Status  New    Target Date  11/26/18      PT LONG TERM GOAL #3   Title  Pt will improve her R knee strength to >/= +4/5 in order to improve functional mobility.     Time  6    Period  Weeks    Status  New    Target Date  11/26/18      PT LONG TERM GOAL #4   Title  Pt will be able to walk 20 minutes with pain in R knee </= 2/10.     Time  6    Period  Weeks    Status  New    Target Date  11/26/18            Plan - 11/09/18 1801    Clinical Impression Statement  Dizziness increased on unstable surfaces but did not complain of knee pain until the end of treatment where she used one hand to stand from the table twisting her knee. Significant fear in balance.     PT Treatment/Interventions  ADLs/Self Care Home Management;Cryotherapy;Electrical Stimulation;Gait training;Stair training;Functional mobility training;Therapeutic  activities;Therapeutic exercise;Balance training;Neuromuscular re-education;Patient/family education;Manual techniques;Vestibular;Passive range of motion;Taping    PT Next Visit Plan  gross LE strength, cont proprioception    PT Home Exercise Plan  hamstring and heel cord stretches in supine, sit to stand, LAQ's.     Consulted and Agree with Plan of Care  Patient       Patient will benefit from skilled therapeutic intervention in order to improve the following deficits and impairments:  Pain, Decreased activity tolerance, Decreased strength, Decreased balance, Difficulty walking  Visit Diagnosis: Chronic pain of right knee  Difficulty in walking, not elsewhere classified  Muscle weakness (generalized)     Problem List Patient Active Problem List   Diagnosis Date Noted  . Complete rupture of left rotator cuff 09/28/2015    Karsin Pesta C. Lakely Elmendorf PT, DPT 11/09/18 6:06 PM   [facility] [address] [place], [place], [address] Phone: [phone]   Fax:  [phone]  Name: Jacques EarthlyDolores D Yambao MRN: [id number] Date of Birth: 1933/03/22

## 2018-11-12 ENCOUNTER — Encounter: Payer: Self-pay | Admitting: Physical Therapy

## 2018-11-12 ENCOUNTER — Ambulatory Visit: Payer: Medicare Other | Admitting: Physical Therapy

## 2018-11-12 DIAGNOSIS — M25561 Pain in right knee: Principal | ICD-10-CM

## 2018-11-12 DIAGNOSIS — R262 Difficulty in walking, not elsewhere classified: Secondary | ICD-10-CM

## 2018-11-12 DIAGNOSIS — G8929 Other chronic pain: Secondary | ICD-10-CM

## 2018-11-12 DIAGNOSIS — M6281 Muscle weakness (generalized): Secondary | ICD-10-CM

## 2018-11-12 NOTE — Therapy (Addendum)
[place], [place], [address] Phone: [phone]   Fax:  [phone]  Physical Therapy Treatment/Discharge  Patient Details  Name: Cheryl Quinn MRN: [id number] Date of Birth: 04-27-1933 Referring Provider (PT): Lavone Orn, MD   Encounter Date: 11/12/2018  PT End of Session - 11/12/18 0850    Visit Number  5    Number of Visits  13    Authorization Type  UHC/ Medicare KX modifier after 15 visits    PT Start Time  1324    PT Stop Time  0928    PT Time Calculation (min)  41 min    Activity Tolerance  Patient tolerated treatment well    Behavior During Therapy  [facility] for tasks assessed/performed       Past Medical History:  Diagnosis Date  . Arthritis    hips,shoulders  . Asthma   . Complete rupture of left rotator cuff 09/28/2015  . Diabetes mellitus without complication ([place])   . GERD (gastroesophageal reflux disease)    with spicy foods  . Glaucoma   . Hyperlipidemia   . Hypertension   . PONV (postoperative nausea and vomiting)     Past Surgical History:  Procedure Laterality Date  . ABDOMINAL HYSTERECTOMY    . BLEPHAROPLASTY    . BREAST EXCISIONAL BIOPSY Right 2003   Benign  . BUNIONECTOMY WITH HAMMERTOE RECONSTRUCTION Bilateral   . CHOLECYSTECTOMY    . EYE SURGERY     cataract  . SHOULDER ARTHROSCOPY WITH ROTATOR CUFF REPAIR Left 09/28/2015   Procedure: LEFT SHOULDER ARTHROSCOPY DEBRIDEMENT, ROTATOR CUFF REPAIR;  Surgeon: Marchia Bond, MD;  Location: [facility];  Service: Orthopedics;  Laterality: Left;  . TRIGGER FINGER RELEASE Left   . WRIST FRACTURE SURGERY Right     There were no vitals filed for this visit.  Subjective Assessment - 11/12/18 0849    Subjective  Doing well today, no pain.     Currently in Pain?  No/denies                       [facility] Adult PT Treatment/Exercise - 11/12/18 0001      Knee/Hip Exercises: Stretches   Passive  Hamstring Stretch  Both;2 reps;30 seconds    Passive Hamstring Stretch Limitations  seated EOB       Knee/Hip Exercises: Aerobic   Nustep  5 min L4 LE only      Knee/Hip Exercises: Standing   Heel Raises Limitations  toe raises back to wall- balance    Lateral Step Up  Step Height: 4";Hand Hold: 1;15 reps;Both    Other Standing Knee Exercises  step taps with single arm support    Other Standing Knee Exercises  hamstring curls ball bw knees      Knee/Hip Exercises: Seated   Clamshell with TheraBand  Red   alt leg pull red tband seated on dynadisk   Other Seated Knee/Hip Exercises  trunk rotation on dynadisk    Other Seated Knee/Hip Exercises  straight leg lift seated on dynadisk from 4" step                  PT Long Term Goals - 10/15/18 1149      PT LONG TERM GOAL #1   Title  Pt will be independent in her HEP and prorgression.    Time  6    Period  Weeks    Status  New  Target Date  11/26/18      PT LONG TERM GOAL #2   Title  Pt will be able to perform 5 time sit to stand </= 15 seconds with no UE support.     Time  6    Period  Weeks    Status  New    Target Date  11/26/18      PT LONG TERM GOAL #3   Title  Pt will improve her R knee strength to >/= +4/5 in order to improve functional mobility.     Time  6    Period  Weeks    Status  New    Target Date  11/26/18      PT LONG TERM GOAL #4   Title  Pt will be able to walk 20 minutes with pain in R knee </= 2/10.     Time  6    Period  Weeks    Status  New    Target Date  11/26/18            Plan - 11/12/18 0926    Clinical Impression Statement  good tolerance to standing exercises as long as she had a single hand for support. seated exercises on dynadisk again for proprioception. no complaints of pain.     PT Treatment/Interventions  ADLs/Self Care Home Management;Cryotherapy;Electrical Stimulation;Gait training;Stair training;Functional mobility training;Therapeutic activities;Therapeutic  exercise;Balance training;Neuromuscular re-education;Patient/family education;Manual techniques;Vestibular;Passive range of motion;Taping    PT Next Visit Plan  gross LE strength, cont proprioception    PT Home Exercise Plan  hamstring and heel cord stretches in supine, sit to stand, LAQ's. HS curls    Consulted and Agree with Plan of Care  Patient       Patient will benefit from skilled therapeutic intervention in order to improve the following deficits and impairments:  Pain, Decreased activity tolerance, Decreased strength, Decreased balance, Difficulty walking  Visit Diagnosis: Chronic pain of right knee  Difficulty in walking, not elsewhere classified  Muscle weakness (generalized)     Problem List Patient Active Problem List   Diagnosis Date Noted  . Complete rupture of left rotator cuff 09/28/2015    Nysir Fergusson C. Aslee Such PT, DPT 11/12/18 9:37 AM   [facility] [address] [place], [place], [address] Phone: [phone]   Fax:  [phone]  Name: Cheryl Quinn MRN: [id number] Date of Birth: November 13, 1933  PHYSICAL THERAPY DISCHARGE SUMMARY  Visits from Start of Care: 5  Current functional level related to goals / functional outcomes: See above   Remaining deficits: See above   Education / Equipment: Anatomy of condition, POC,HEP, exercise form/rationale  Plan: Patient agrees to discharge.  Patient goals were not met. Patient is being discharged due to the patient's request.  ?????    Pt cancelled her last scheduled appointment due to illness.    Briele Lagasse C. Maxon Kresse PT, DPT 01/05/19 1:08 PM

## 2018-11-22 ENCOUNTER — Ambulatory Visit: Payer: Medicare Other | Admitting: Physical Therapy

## 2019-03-21 ENCOUNTER — Other Ambulatory Visit: Payer: Self-pay

## 2019-03-21 ENCOUNTER — Encounter: Payer: Self-pay | Admitting: Podiatry

## 2019-03-21 ENCOUNTER — Other Ambulatory Visit: Payer: Self-pay | Admitting: Podiatry

## 2019-03-21 ENCOUNTER — Ambulatory Visit: Payer: Medicare Other | Admitting: Podiatry

## 2019-03-21 ENCOUNTER — Ambulatory Visit (INDEPENDENT_AMBULATORY_CARE_PROVIDER_SITE_OTHER): Payer: Medicare Other

## 2019-03-21 VITALS — Temp 97.2°F

## 2019-03-21 DIAGNOSIS — M779 Enthesopathy, unspecified: Secondary | ICD-10-CM

## 2019-03-21 DIAGNOSIS — M79671 Pain in right foot: Secondary | ICD-10-CM

## 2019-03-21 DIAGNOSIS — M7751 Other enthesopathy of right foot: Secondary | ICD-10-CM

## 2019-03-21 MED ORDER — TRIAMCINOLONE ACETONIDE 10 MG/ML IJ SUSP
10.0000 mg | Freq: Once | INTRAMUSCULAR | Status: AC
  Administered 2019-03-21: 10 mg

## 2019-03-21 NOTE — Progress Notes (Signed)
Subjective:   Patient ID: Cheryl Quinn, female   DOB: 85 y.o.   MRN: [id number]   HPI Patient presents with a lot of pain in the outside of the right foot and states that it started a little while ago and is worsened over the last few days and it is hard for her to bear weight or walk comfortably on her foot   ROS      Objective:  Physical Exam  Neurovascular status found to be intact with quite a bit of inflammation of the lateral side right foot around the base of the fifth metatarsal with fluid buildup and no indications of tendon dysfunction     Assessment:  Peroneal tendinitis right with inflammation fluid buildup     Plan:  H&P x-rays reviewed and today I did a lateral injection of the peroneal tendon at insertion base of fifth metatarsal 3 mg dexamethasone Kenalog 5 mg Xylocaine after sterile prep.  I instructed her on stretching exercises ice therapy and patient will be seen back to recheck  X-rays indicated patient has quite a bit of midfoot arthritis right had previous bunion and metatarsal surgery but clinically she is extremely happy with and radiographically shows some changes but I do think it stable

## 2019-05-14 ENCOUNTER — Ambulatory Visit
Admission: RE | Admit: 2019-05-14 | Discharge: 2019-05-14 | Disposition: A | Discharge status: Home / Self Care | Payer: Medicare Other | Source: Ambulatory Visit | Attending: Internal Medicine | Admitting: Internal Medicine

## 2019-05-14 DIAGNOSIS — Z1231 Encounter for screening mammogram for malignant neoplasm of breast: Secondary | ICD-10-CM

## 2019-06-01 ENCOUNTER — Other Ambulatory Visit: Payer: Self-pay | Admitting: Internal Medicine

## 2019-06-01 ENCOUNTER — Ambulatory Visit
Admission: RE | Admit: 2019-06-01 | Discharge: 2019-06-01 | Disposition: A | Discharge status: Home / Self Care | Payer: Medicare Other | Source: Ambulatory Visit | Attending: Internal Medicine | Admitting: Internal Medicine

## 2019-06-01 DIAGNOSIS — R0609 Other forms of dyspnea: Secondary | ICD-10-CM

## 2019-12-13 ENCOUNTER — Ambulatory Visit: Payer: Medicare PPO | Attending: Internal Medicine

## 2019-12-13 DIAGNOSIS — Z23 Encounter for immunization: Secondary | ICD-10-CM | POA: Insufficient documentation

## 2019-12-13 NOTE — Progress Notes (Signed)
   Covid-19 Vaccination Clinic  Name:  Cheryl Quinn    MRN: [id number] DOB: 1933-10-29  12/13/2019  [name] was observed post Covid-19 immunization for 15 minutes without incidence. She was provided with Vaccine Information Sheet and instruction to access the V-Safe system.   [name] was instructed to call 911 with any severe reactions post vaccine: Marland Kitchen Difficulty breathing  . Swelling of your face and throat  . A fast heartbeat  . A bad rash all over your body  . Dizziness and weakness    Immunizations Administered    Name Date Dose VIS Date Route   Pfizer COVID-19 Vaccine 12/13/2019  3:37 PM 0.3 mL 11/04/2019 Intramuscular   Manufacturer: [employer], [employer]   Lot: [id number]   NDC: [address]-4

## 2020-01-04 ENCOUNTER — Ambulatory Visit: Payer: Medicare PPO | Attending: Internal Medicine

## 2020-01-04 DIAGNOSIS — Z23 Encounter for immunization: Secondary | ICD-10-CM | POA: Insufficient documentation

## 2020-01-04 NOTE — Progress Notes (Signed)
   Covid-19 Vaccination Clinic  Name:  Cheryl Quinn    MRN: [id number] DOB: 01-01-1933  01/04/2020  Cheryl Quinn was observed post Covid-19 immunization for 15 minutes without incidence. She was provided with Vaccine Information Sheet and instruction to access the V-Safe system.   Cheryl Quinn was instructed to call 911 with any severe reactions post vaccine: Marland Kitchen Difficulty breathing  . Swelling of your face and throat  . A fast heartbeat  . A bad rash all over your body  . Dizziness and weakness    Immunizations Administered    Name Date Dose VIS Date Route   Pfizer COVID-19 Vaccine 01/04/2020  8:25 AM 0.3 mL 11/04/2019 Intramuscular   Manufacturer: [employer], [employer]   Lot: [id number]   NDC: [address]-7

## 2020-03-20 DIAGNOSIS — M25512 Pain in left shoulder: Secondary | ICD-10-CM | POA: Diagnosis not present

## 2020-03-20 DIAGNOSIS — M25562 Pain in left knee: Secondary | ICD-10-CM | POA: Diagnosis not present

## 2020-03-20 DIAGNOSIS — E1169 Type 2 diabetes mellitus with other specified complication: Secondary | ICD-10-CM | POA: Diagnosis not present

## 2020-03-20 DIAGNOSIS — I10 Essential (primary) hypertension: Secondary | ICD-10-CM | POA: Diagnosis not present

## 2020-03-23 DIAGNOSIS — M25562 Pain in left knee: Secondary | ICD-10-CM | POA: Diagnosis not present

## 2020-03-23 DIAGNOSIS — M25512 Pain in left shoulder: Secondary | ICD-10-CM | POA: Diagnosis not present

## 2020-04-03 ENCOUNTER — Other Ambulatory Visit: Payer: Self-pay | Admitting: Internal Medicine

## 2020-04-03 DIAGNOSIS — Z1231 Encounter for screening mammogram for malignant neoplasm of breast: Secondary | ICD-10-CM

## 2020-04-09 ENCOUNTER — Ambulatory Visit (INDEPENDENT_AMBULATORY_CARE_PROVIDER_SITE_OTHER): Payer: Medicare PPO

## 2020-04-09 ENCOUNTER — Other Ambulatory Visit: Payer: Self-pay | Admitting: Podiatrist

## 2020-04-09 ENCOUNTER — Encounter: Payer: Self-pay | Admitting: Podiatrist

## 2020-04-09 ENCOUNTER — Ambulatory Visit: Payer: Medicare PPO | Admitting: Podiatrist

## 2020-04-09 ENCOUNTER — Other Ambulatory Visit: Payer: Self-pay

## 2020-04-09 DIAGNOSIS — M79671 Pain in right foot: Secondary | ICD-10-CM

## 2020-04-09 DIAGNOSIS — M722 Plantar fascial fibromatosis: Secondary | ICD-10-CM | POA: Diagnosis not present

## 2020-04-09 NOTE — Progress Notes (Addendum)
  Chief Complaint  Patient presents with  . Foot Pain    pt has a possible cyst of the right foot arche, that has been going on for about 2-3 weeks, pt states that it is painful to the touch     HPI: Patient is 86 y.o. female who presents today for a hard knot on the bottom of the right foot.  She has noticed it for a couple of weeks and states it is painful when she walks  Review of Systems No fevers, chills, nausea, muscle aches, no difficulty breathing, no calf pain, no chest pain or shortness of breath.   Physical Exam  GENERAL APPEARANCE: Alert, conversant. Appropriately groomed. No acute distress.   VASCULAR: Pedal pulses palpable DP and PT bilateral.  Capillary refill time is immediate to all digits,  Proximal to distal cooling it warm to warm.  Digital hair growth is present bilateral   NEUROLOGIC: sensation is intact epicritically and protectively to 5.07 monofilament at 5/5 sites bilateral.  Light touch is intact bilateral, vibratory sensation intact bilateral, achilles tendon reflex is intact bilateral.   MUSCULOSKELETAL: acceptable muscle strength, tone and stability bilateral.  Appreciable flat foot deformity is noted on the right foot.  Contracture of digits noted.  Palpable nodule on the plantar medial aspect of the plantar fascia is noted beneath the first metatarsal region.  Some discomfort with palpation noted.    DERMATOLOGIC: skin is warm, supple, and dry.  No open lesions noted.  No rash, no pre ulcerative lesions. Digital nails are asymptomatic.    xrays show no fracture or dislocation.  Multiple arthritic changes seen along with pes planus deformity.  Fifth metatarsal screw in good alignment and appears stable.    Assessment     ICD-10-CM   1. Right foot pain  M79.671 DG Foot Complete Right  2. Plantar fibromatosis  M72.2      Plan  Discussed conservative treatment options including padding to offload and a steroid injection.  Patient opted to pad the area  as she is getting ready to leave town for 9 days to attend a [employer].  She will pad to offload the area and will call if she would like to consider a steroid injection.

## 2020-04-09 NOTE — Patient Instructions (Signed)
You have a plantar fibroma-  It is a benign lesion that grows within the plantar fascia of your foot.  You can leave it alone, or treat with a steroid injection in the future if it becomes painful.

## 2020-04-23 DIAGNOSIS — E669 Obesity, unspecified: Secondary | ICD-10-CM | POA: Diagnosis not present

## 2020-04-23 DIAGNOSIS — H401130 Primary open-angle glaucoma, bilateral, stage unspecified: Secondary | ICD-10-CM | POA: Diagnosis not present

## 2020-04-23 DIAGNOSIS — D509 Iron deficiency anemia, unspecified: Secondary | ICD-10-CM | POA: Diagnosis not present

## 2020-04-23 DIAGNOSIS — Z4789 Encounter for other orthopedic aftercare: Secondary | ICD-10-CM | POA: Diagnosis not present

## 2020-04-23 DIAGNOSIS — E785 Hyperlipidemia, unspecified: Secondary | ICD-10-CM | POA: Insufficient documentation

## 2020-04-23 DIAGNOSIS — S72352A Displaced comminuted fracture of shaft of left femur, initial encounter for closed fracture: Secondary | ICD-10-CM | POA: Diagnosis not present

## 2020-04-23 DIAGNOSIS — R609 Edema, unspecified: Secondary | ICD-10-CM | POA: Insufficient documentation

## 2020-04-23 DIAGNOSIS — Z9181 History of falling: Secondary | ICD-10-CM | POA: Diagnosis not present

## 2020-04-23 DIAGNOSIS — E119 Type 2 diabetes mellitus without complications: Secondary | ICD-10-CM | POA: Diagnosis not present

## 2020-04-23 DIAGNOSIS — R279 Unspecified lack of coordination: Secondary | ICD-10-CM | POA: Diagnosis not present

## 2020-04-23 DIAGNOSIS — S72352D Displaced comminuted fracture of shaft of left femur, subsequent encounter for closed fracture with routine healing: Secondary | ICD-10-CM | POA: Diagnosis not present

## 2020-04-23 DIAGNOSIS — E1140 Type 2 diabetes mellitus with diabetic neuropathy, unspecified: Secondary | ICD-10-CM | POA: Diagnosis not present

## 2020-04-23 DIAGNOSIS — Z741 Need for assistance with personal care: Secondary | ICD-10-CM | POA: Diagnosis not present

## 2020-04-23 DIAGNOSIS — Z9889 Other specified postprocedural states: Secondary | ICD-10-CM | POA: Diagnosis not present

## 2020-04-23 DIAGNOSIS — B961 Klebsiella pneumoniae [K. pneumoniae] as the cause of diseases classified elsewhere: Secondary | ICD-10-CM | POA: Diagnosis not present

## 2020-04-23 DIAGNOSIS — Z7984 Long term (current) use of oral hypoglycemic drugs: Secondary | ICD-10-CM | POA: Diagnosis not present

## 2020-04-23 DIAGNOSIS — R2689 Other abnormalities of gait and mobility: Secondary | ICD-10-CM | POA: Diagnosis not present

## 2020-04-23 DIAGNOSIS — R52 Pain, unspecified: Secondary | ICD-10-CM | POA: Diagnosis not present

## 2020-04-23 DIAGNOSIS — Z8781 Personal history of (healed) traumatic fracture: Secondary | ICD-10-CM | POA: Diagnosis not present

## 2020-04-23 DIAGNOSIS — I10 Essential (primary) hypertension: Secondary | ICD-10-CM | POA: Insufficient documentation

## 2020-04-23 DIAGNOSIS — S7222XA Displaced subtrochanteric fracture of left femur, initial encounter for closed fracture: Secondary | ICD-10-CM | POA: Diagnosis not present

## 2020-04-23 DIAGNOSIS — H409 Unspecified glaucoma: Secondary | ICD-10-CM | POA: Diagnosis not present

## 2020-04-23 DIAGNOSIS — D649 Anemia, unspecified: Secondary | ICD-10-CM | POA: Insufficient documentation

## 2020-04-23 DIAGNOSIS — Z6832 Body mass index (BMI) 32.0-32.9, adult: Secondary | ICD-10-CM | POA: Diagnosis not present

## 2020-04-23 DIAGNOSIS — N390 Urinary tract infection, site not specified: Secondary | ICD-10-CM | POA: Diagnosis not present

## 2020-04-23 DIAGNOSIS — S72142A Displaced intertrochanteric fracture of left femur, initial encounter for closed fracture: Secondary | ICD-10-CM | POA: Diagnosis not present

## 2020-04-27 DIAGNOSIS — Z741 Need for assistance with personal care: Secondary | ICD-10-CM | POA: Diagnosis not present

## 2020-04-27 DIAGNOSIS — S72352D Displaced comminuted fracture of shaft of left femur, subsequent encounter for closed fracture with routine healing: Secondary | ICD-10-CM | POA: Diagnosis not present

## 2020-04-27 DIAGNOSIS — R609 Edema, unspecified: Secondary | ICD-10-CM | POA: Diagnosis not present

## 2020-04-27 DIAGNOSIS — R52 Pain, unspecified: Secondary | ICD-10-CM | POA: Diagnosis not present

## 2020-04-27 DIAGNOSIS — R279 Unspecified lack of coordination: Secondary | ICD-10-CM | POA: Diagnosis not present

## 2020-04-27 DIAGNOSIS — Z4789 Encounter for other orthopedic aftercare: Secondary | ICD-10-CM | POA: Diagnosis not present

## 2020-04-27 DIAGNOSIS — H401130 Primary open-angle glaucoma, bilateral, stage unspecified: Secondary | ICD-10-CM | POA: Diagnosis not present

## 2020-04-27 DIAGNOSIS — Z9181 History of falling: Secondary | ICD-10-CM | POA: Diagnosis not present

## 2020-04-27 DIAGNOSIS — R2681 Unsteadiness on feet: Secondary | ICD-10-CM | POA: Diagnosis not present

## 2020-04-27 DIAGNOSIS — I10 Essential (primary) hypertension: Secondary | ICD-10-CM | POA: Diagnosis not present

## 2020-04-27 DIAGNOSIS — E119 Type 2 diabetes mellitus without complications: Secondary | ICD-10-CM | POA: Diagnosis not present

## 2020-04-27 DIAGNOSIS — D509 Iron deficiency anemia, unspecified: Secondary | ICD-10-CM | POA: Diagnosis not present

## 2020-04-27 DIAGNOSIS — N390 Urinary tract infection, site not specified: Secondary | ICD-10-CM | POA: Diagnosis not present

## 2020-04-27 DIAGNOSIS — S72142A Displaced intertrochanteric fracture of left femur, initial encounter for closed fracture: Secondary | ICD-10-CM | POA: Diagnosis not present

## 2020-04-27 DIAGNOSIS — R2689 Other abnormalities of gait and mobility: Secondary | ICD-10-CM | POA: Diagnosis not present

## 2020-04-27 DIAGNOSIS — S72145A Nondisplaced intertrochanteric fracture of left femur, initial encounter for closed fracture: Secondary | ICD-10-CM | POA: Diagnosis not present

## 2020-04-27 DIAGNOSIS — R531 Weakness: Secondary | ICD-10-CM | POA: Diagnosis not present

## 2020-04-28 DIAGNOSIS — R2681 Unsteadiness on feet: Secondary | ICD-10-CM | POA: Diagnosis not present

## 2020-04-28 DIAGNOSIS — E119 Type 2 diabetes mellitus without complications: Secondary | ICD-10-CM | POA: Diagnosis not present

## 2020-04-28 DIAGNOSIS — S72145A Nondisplaced intertrochanteric fracture of left femur, initial encounter for closed fracture: Secondary | ICD-10-CM | POA: Diagnosis not present

## 2020-04-28 DIAGNOSIS — R531 Weakness: Secondary | ICD-10-CM | POA: Diagnosis not present

## 2020-04-30 DIAGNOSIS — R2681 Unsteadiness on feet: Secondary | ICD-10-CM | POA: Diagnosis not present

## 2020-04-30 DIAGNOSIS — R531 Weakness: Secondary | ICD-10-CM | POA: Diagnosis not present

## 2020-04-30 DIAGNOSIS — S72145A Nondisplaced intertrochanteric fracture of left femur, initial encounter for closed fracture: Secondary | ICD-10-CM | POA: Diagnosis not present

## 2020-04-30 DIAGNOSIS — E119 Type 2 diabetes mellitus without complications: Secondary | ICD-10-CM | POA: Diagnosis not present

## 2020-05-05 DIAGNOSIS — S72145A Nondisplaced intertrochanteric fracture of left femur, initial encounter for closed fracture: Secondary | ICD-10-CM | POA: Diagnosis not present

## 2020-05-05 DIAGNOSIS — R2681 Unsteadiness on feet: Secondary | ICD-10-CM | POA: Diagnosis not present

## 2020-05-05 DIAGNOSIS — R531 Weakness: Secondary | ICD-10-CM | POA: Diagnosis not present

## 2020-05-05 DIAGNOSIS — E119 Type 2 diabetes mellitus without complications: Secondary | ICD-10-CM | POA: Diagnosis not present

## 2020-05-07 DIAGNOSIS — E119 Type 2 diabetes mellitus without complications: Secondary | ICD-10-CM | POA: Diagnosis not present

## 2020-05-07 DIAGNOSIS — R531 Weakness: Secondary | ICD-10-CM | POA: Diagnosis not present

## 2020-05-07 DIAGNOSIS — R2681 Unsteadiness on feet: Secondary | ICD-10-CM | POA: Diagnosis not present

## 2020-05-07 DIAGNOSIS — S72145A Nondisplaced intertrochanteric fracture of left femur, initial encounter for closed fracture: Secondary | ICD-10-CM | POA: Diagnosis not present

## 2020-05-13 DIAGNOSIS — E119 Type 2 diabetes mellitus without complications: Secondary | ICD-10-CM | POA: Diagnosis not present

## 2020-05-13 DIAGNOSIS — R2681 Unsteadiness on feet: Secondary | ICD-10-CM | POA: Diagnosis not present

## 2020-05-13 DIAGNOSIS — S72145A Nondisplaced intertrochanteric fracture of left femur, initial encounter for closed fracture: Secondary | ICD-10-CM | POA: Diagnosis not present

## 2020-05-14 ENCOUNTER — Ambulatory Visit: Payer: Medicare PPO

## 2020-05-14 DIAGNOSIS — R531 Weakness: Secondary | ICD-10-CM | POA: Diagnosis not present

## 2020-05-14 DIAGNOSIS — E119 Type 2 diabetes mellitus without complications: Secondary | ICD-10-CM | POA: Diagnosis not present

## 2020-05-14 DIAGNOSIS — R2681 Unsteadiness on feet: Secondary | ICD-10-CM | POA: Diagnosis not present

## 2020-05-14 DIAGNOSIS — S72145A Nondisplaced intertrochanteric fracture of left femur, initial encounter for closed fracture: Secondary | ICD-10-CM | POA: Diagnosis not present

## 2020-05-15 ENCOUNTER — Ambulatory Visit: Payer: Medicare PPO

## 2020-05-17 DIAGNOSIS — M79652 Pain in left thigh: Secondary | ICD-10-CM | POA: Diagnosis not present

## 2020-05-17 DIAGNOSIS — M25552 Pain in left hip: Secondary | ICD-10-CM | POA: Diagnosis not present

## 2020-05-18 DIAGNOSIS — M79605 Pain in left leg: Secondary | ICD-10-CM | POA: Diagnosis not present

## 2020-05-18 DIAGNOSIS — R601 Generalized edema: Secondary | ICD-10-CM | POA: Diagnosis not present

## 2020-05-18 DIAGNOSIS — M79604 Pain in right leg: Secondary | ICD-10-CM | POA: Diagnosis not present

## 2020-05-21 DIAGNOSIS — R262 Difficulty in walking, not elsewhere classified: Secondary | ICD-10-CM | POA: Diagnosis not present

## 2020-05-21 DIAGNOSIS — R2681 Unsteadiness on feet: Secondary | ICD-10-CM | POA: Diagnosis not present

## 2020-05-21 DIAGNOSIS — S72145A Nondisplaced intertrochanteric fracture of left femur, initial encounter for closed fracture: Secondary | ICD-10-CM | POA: Diagnosis not present

## 2020-05-21 DIAGNOSIS — E119 Type 2 diabetes mellitus without complications: Secondary | ICD-10-CM | POA: Diagnosis not present

## 2020-05-21 DIAGNOSIS — L602 Onychogryphosis: Secondary | ICD-10-CM | POA: Diagnosis not present

## 2020-05-21 DIAGNOSIS — L603 Nail dystrophy: Secondary | ICD-10-CM | POA: Diagnosis not present

## 2020-05-31 DIAGNOSIS — Z8781 Personal history of (healed) traumatic fracture: Secondary | ICD-10-CM | POA: Diagnosis not present

## 2020-05-31 DIAGNOSIS — E1169 Type 2 diabetes mellitus with other specified complication: Secondary | ICD-10-CM | POA: Diagnosis not present

## 2020-06-04 DIAGNOSIS — M25562 Pain in left knee: Secondary | ICD-10-CM | POA: Diagnosis not present

## 2020-06-04 DIAGNOSIS — S7292XA Unspecified fracture of left femur, initial encounter for closed fracture: Secondary | ICD-10-CM | POA: Diagnosis not present

## 2020-06-05 DIAGNOSIS — S7292XD Unspecified fracture of left femur, subsequent encounter for closed fracture with routine healing: Secondary | ICD-10-CM | POA: Diagnosis not present

## 2020-06-05 DIAGNOSIS — E1169 Type 2 diabetes mellitus with other specified complication: Secondary | ICD-10-CM | POA: Diagnosis not present

## 2020-06-05 DIAGNOSIS — I872 Venous insufficiency (chronic) (peripheral): Secondary | ICD-10-CM | POA: Diagnosis not present

## 2020-06-05 DIAGNOSIS — I10 Essential (primary) hypertension: Secondary | ICD-10-CM | POA: Diagnosis not present

## 2020-06-05 DIAGNOSIS — M12811 Other specific arthropathies, not elsewhere classified, right shoulder: Secondary | ICD-10-CM | POA: Diagnosis not present

## 2020-06-05 DIAGNOSIS — M25562 Pain in left knee: Secondary | ICD-10-CM | POA: Diagnosis not present

## 2020-06-05 DIAGNOSIS — M19049 Primary osteoarthritis, unspecified hand: Secondary | ICD-10-CM | POA: Diagnosis not present

## 2020-06-05 DIAGNOSIS — M7501 Adhesive capsulitis of right shoulder: Secondary | ICD-10-CM | POA: Diagnosis not present

## 2020-06-05 DIAGNOSIS — J4520 Mild intermittent asthma, uncomplicated: Secondary | ICD-10-CM | POA: Diagnosis not present

## 2020-06-09 DIAGNOSIS — I10 Essential (primary) hypertension: Secondary | ICD-10-CM | POA: Diagnosis not present

## 2020-06-09 DIAGNOSIS — M25562 Pain in left knee: Secondary | ICD-10-CM | POA: Diagnosis not present

## 2020-06-09 DIAGNOSIS — J4520 Mild intermittent asthma, uncomplicated: Secondary | ICD-10-CM | POA: Diagnosis not present

## 2020-06-09 DIAGNOSIS — M12811 Other specific arthropathies, not elsewhere classified, right shoulder: Secondary | ICD-10-CM | POA: Diagnosis not present

## 2020-06-09 DIAGNOSIS — I872 Venous insufficiency (chronic) (peripheral): Secondary | ICD-10-CM | POA: Diagnosis not present

## 2020-06-09 DIAGNOSIS — E1169 Type 2 diabetes mellitus with other specified complication: Secondary | ICD-10-CM | POA: Diagnosis not present

## 2020-06-09 DIAGNOSIS — M7501 Adhesive capsulitis of right shoulder: Secondary | ICD-10-CM | POA: Diagnosis not present

## 2020-06-09 DIAGNOSIS — S7292XD Unspecified fracture of left femur, subsequent encounter for closed fracture with routine healing: Secondary | ICD-10-CM | POA: Diagnosis not present

## 2020-06-09 DIAGNOSIS — M19049 Primary osteoarthritis, unspecified hand: Secondary | ICD-10-CM | POA: Diagnosis not present

## 2020-06-11 DIAGNOSIS — M25562 Pain in left knee: Secondary | ICD-10-CM | POA: Diagnosis not present

## 2020-06-12 DIAGNOSIS — M7501 Adhesive capsulitis of right shoulder: Secondary | ICD-10-CM | POA: Diagnosis not present

## 2020-06-12 DIAGNOSIS — J4520 Mild intermittent asthma, uncomplicated: Secondary | ICD-10-CM | POA: Diagnosis not present

## 2020-06-12 DIAGNOSIS — M19049 Primary osteoarthritis, unspecified hand: Secondary | ICD-10-CM | POA: Diagnosis not present

## 2020-06-12 DIAGNOSIS — S7292XD Unspecified fracture of left femur, subsequent encounter for closed fracture with routine healing: Secondary | ICD-10-CM | POA: Diagnosis not present

## 2020-06-12 DIAGNOSIS — E1169 Type 2 diabetes mellitus with other specified complication: Secondary | ICD-10-CM | POA: Diagnosis not present

## 2020-06-12 DIAGNOSIS — I10 Essential (primary) hypertension: Secondary | ICD-10-CM | POA: Diagnosis not present

## 2020-06-12 DIAGNOSIS — M25562 Pain in left knee: Secondary | ICD-10-CM | POA: Diagnosis not present

## 2020-06-12 DIAGNOSIS — M12811 Other specific arthropathies, not elsewhere classified, right shoulder: Secondary | ICD-10-CM | POA: Diagnosis not present

## 2020-06-12 DIAGNOSIS — I872 Venous insufficiency (chronic) (peripheral): Secondary | ICD-10-CM | POA: Diagnosis not present

## 2020-06-13 DIAGNOSIS — S7292XD Unspecified fracture of left femur, subsequent encounter for closed fracture with routine healing: Secondary | ICD-10-CM | POA: Diagnosis not present

## 2020-06-13 DIAGNOSIS — M25562 Pain in left knee: Secondary | ICD-10-CM | POA: Diagnosis not present

## 2020-06-14 DIAGNOSIS — M12811 Other specific arthropathies, not elsewhere classified, right shoulder: Secondary | ICD-10-CM | POA: Diagnosis not present

## 2020-06-14 DIAGNOSIS — S7292XD Unspecified fracture of left femur, subsequent encounter for closed fracture with routine healing: Secondary | ICD-10-CM | POA: Diagnosis not present

## 2020-06-14 DIAGNOSIS — J4520 Mild intermittent asthma, uncomplicated: Secondary | ICD-10-CM | POA: Diagnosis not present

## 2020-06-14 DIAGNOSIS — M25562 Pain in left knee: Secondary | ICD-10-CM | POA: Diagnosis not present

## 2020-06-14 DIAGNOSIS — I872 Venous insufficiency (chronic) (peripheral): Secondary | ICD-10-CM | POA: Diagnosis not present

## 2020-06-14 DIAGNOSIS — E1169 Type 2 diabetes mellitus with other specified complication: Secondary | ICD-10-CM | POA: Diagnosis not present

## 2020-06-14 DIAGNOSIS — M19049 Primary osteoarthritis, unspecified hand: Secondary | ICD-10-CM | POA: Diagnosis not present

## 2020-06-14 DIAGNOSIS — I10 Essential (primary) hypertension: Secondary | ICD-10-CM | POA: Diagnosis not present

## 2020-06-14 DIAGNOSIS — M7501 Adhesive capsulitis of right shoulder: Secondary | ICD-10-CM | POA: Diagnosis not present

## 2020-06-19 DIAGNOSIS — I10 Essential (primary) hypertension: Secondary | ICD-10-CM | POA: Diagnosis not present

## 2020-06-19 DIAGNOSIS — J4520 Mild intermittent asthma, uncomplicated: Secondary | ICD-10-CM | POA: Diagnosis not present

## 2020-06-19 DIAGNOSIS — M12811 Other specific arthropathies, not elsewhere classified, right shoulder: Secondary | ICD-10-CM | POA: Diagnosis not present

## 2020-06-19 DIAGNOSIS — M25562 Pain in left knee: Secondary | ICD-10-CM | POA: Diagnosis not present

## 2020-06-19 DIAGNOSIS — S7292XD Unspecified fracture of left femur, subsequent encounter for closed fracture with routine healing: Secondary | ICD-10-CM | POA: Diagnosis not present

## 2020-06-19 DIAGNOSIS — M19049 Primary osteoarthritis, unspecified hand: Secondary | ICD-10-CM | POA: Diagnosis not present

## 2020-06-19 DIAGNOSIS — E1169 Type 2 diabetes mellitus with other specified complication: Secondary | ICD-10-CM | POA: Diagnosis not present

## 2020-06-19 DIAGNOSIS — M7501 Adhesive capsulitis of right shoulder: Secondary | ICD-10-CM | POA: Diagnosis not present

## 2020-06-19 DIAGNOSIS — I872 Venous insufficiency (chronic) (peripheral): Secondary | ICD-10-CM | POA: Diagnosis not present

## 2020-06-21 DIAGNOSIS — I10 Essential (primary) hypertension: Secondary | ICD-10-CM | POA: Diagnosis not present

## 2020-06-21 DIAGNOSIS — M25562 Pain in left knee: Secondary | ICD-10-CM | POA: Diagnosis not present

## 2020-06-21 DIAGNOSIS — M19049 Primary osteoarthritis, unspecified hand: Secondary | ICD-10-CM | POA: Diagnosis not present

## 2020-06-21 DIAGNOSIS — E1169 Type 2 diabetes mellitus with other specified complication: Secondary | ICD-10-CM | POA: Diagnosis not present

## 2020-06-21 DIAGNOSIS — S7292XD Unspecified fracture of left femur, subsequent encounter for closed fracture with routine healing: Secondary | ICD-10-CM | POA: Diagnosis not present

## 2020-06-21 DIAGNOSIS — M12811 Other specific arthropathies, not elsewhere classified, right shoulder: Secondary | ICD-10-CM | POA: Diagnosis not present

## 2020-06-21 DIAGNOSIS — I872 Venous insufficiency (chronic) (peripheral): Secondary | ICD-10-CM | POA: Diagnosis not present

## 2020-06-21 DIAGNOSIS — J4520 Mild intermittent asthma, uncomplicated: Secondary | ICD-10-CM | POA: Diagnosis not present

## 2020-06-21 DIAGNOSIS — M7501 Adhesive capsulitis of right shoulder: Secondary | ICD-10-CM | POA: Diagnosis not present

## 2020-06-26 DIAGNOSIS — I10 Essential (primary) hypertension: Secondary | ICD-10-CM | POA: Diagnosis not present

## 2020-06-26 DIAGNOSIS — I872 Venous insufficiency (chronic) (peripheral): Secondary | ICD-10-CM | POA: Diagnosis not present

## 2020-06-26 DIAGNOSIS — M12811 Other specific arthropathies, not elsewhere classified, right shoulder: Secondary | ICD-10-CM | POA: Diagnosis not present

## 2020-06-26 DIAGNOSIS — E1169 Type 2 diabetes mellitus with other specified complication: Secondary | ICD-10-CM | POA: Diagnosis not present

## 2020-06-26 DIAGNOSIS — J4520 Mild intermittent asthma, uncomplicated: Secondary | ICD-10-CM | POA: Diagnosis not present

## 2020-06-26 DIAGNOSIS — M7501 Adhesive capsulitis of right shoulder: Secondary | ICD-10-CM | POA: Diagnosis not present

## 2020-06-26 DIAGNOSIS — M25562 Pain in left knee: Secondary | ICD-10-CM | POA: Diagnosis not present

## 2020-06-26 DIAGNOSIS — M19049 Primary osteoarthritis, unspecified hand: Secondary | ICD-10-CM | POA: Diagnosis not present

## 2020-06-26 DIAGNOSIS — S7292XD Unspecified fracture of left femur, subsequent encounter for closed fracture with routine healing: Secondary | ICD-10-CM | POA: Diagnosis not present

## 2020-06-28 DIAGNOSIS — M12811 Other specific arthropathies, not elsewhere classified, right shoulder: Secondary | ICD-10-CM | POA: Diagnosis not present

## 2020-06-28 DIAGNOSIS — I10 Essential (primary) hypertension: Secondary | ICD-10-CM | POA: Diagnosis not present

## 2020-06-28 DIAGNOSIS — M19049 Primary osteoarthritis, unspecified hand: Secondary | ICD-10-CM | POA: Diagnosis not present

## 2020-06-28 DIAGNOSIS — J4520 Mild intermittent asthma, uncomplicated: Secondary | ICD-10-CM | POA: Diagnosis not present

## 2020-06-28 DIAGNOSIS — M7501 Adhesive capsulitis of right shoulder: Secondary | ICD-10-CM | POA: Diagnosis not present

## 2020-06-28 DIAGNOSIS — I872 Venous insufficiency (chronic) (peripheral): Secondary | ICD-10-CM | POA: Diagnosis not present

## 2020-06-28 DIAGNOSIS — M25562 Pain in left knee: Secondary | ICD-10-CM | POA: Diagnosis not present

## 2020-06-28 DIAGNOSIS — S7292XD Unspecified fracture of left femur, subsequent encounter for closed fracture with routine healing: Secondary | ICD-10-CM | POA: Diagnosis not present

## 2020-06-28 DIAGNOSIS — E1169 Type 2 diabetes mellitus with other specified complication: Secondary | ICD-10-CM | POA: Diagnosis not present

## 2020-07-03 DIAGNOSIS — M19049 Primary osteoarthritis, unspecified hand: Secondary | ICD-10-CM | POA: Diagnosis not present

## 2020-07-03 DIAGNOSIS — I10 Essential (primary) hypertension: Secondary | ICD-10-CM | POA: Diagnosis not present

## 2020-07-03 DIAGNOSIS — M12811 Other specific arthropathies, not elsewhere classified, right shoulder: Secondary | ICD-10-CM | POA: Diagnosis not present

## 2020-07-03 DIAGNOSIS — E1169 Type 2 diabetes mellitus with other specified complication: Secondary | ICD-10-CM | POA: Diagnosis not present

## 2020-07-03 DIAGNOSIS — M25562 Pain in left knee: Secondary | ICD-10-CM | POA: Diagnosis not present

## 2020-07-03 DIAGNOSIS — M7501 Adhesive capsulitis of right shoulder: Secondary | ICD-10-CM | POA: Diagnosis not present

## 2020-07-03 DIAGNOSIS — I872 Venous insufficiency (chronic) (peripheral): Secondary | ICD-10-CM | POA: Diagnosis not present

## 2020-07-03 DIAGNOSIS — S7292XD Unspecified fracture of left femur, subsequent encounter for closed fracture with routine healing: Secondary | ICD-10-CM | POA: Diagnosis not present

## 2020-07-03 DIAGNOSIS — J4520 Mild intermittent asthma, uncomplicated: Secondary | ICD-10-CM | POA: Diagnosis not present

## 2020-07-05 DIAGNOSIS — M12811 Other specific arthropathies, not elsewhere classified, right shoulder: Secondary | ICD-10-CM | POA: Diagnosis not present

## 2020-07-05 DIAGNOSIS — M7501 Adhesive capsulitis of right shoulder: Secondary | ICD-10-CM | POA: Diagnosis not present

## 2020-07-05 DIAGNOSIS — S7292XD Unspecified fracture of left femur, subsequent encounter for closed fracture with routine healing: Secondary | ICD-10-CM | POA: Diagnosis not present

## 2020-07-05 DIAGNOSIS — E1169 Type 2 diabetes mellitus with other specified complication: Secondary | ICD-10-CM | POA: Diagnosis not present

## 2020-07-05 DIAGNOSIS — M25562 Pain in left knee: Secondary | ICD-10-CM | POA: Diagnosis not present

## 2020-07-05 DIAGNOSIS — I10 Essential (primary) hypertension: Secondary | ICD-10-CM | POA: Diagnosis not present

## 2020-07-05 DIAGNOSIS — J4520 Mild intermittent asthma, uncomplicated: Secondary | ICD-10-CM | POA: Diagnosis not present

## 2020-07-05 DIAGNOSIS — I872 Venous insufficiency (chronic) (peripheral): Secondary | ICD-10-CM | POA: Diagnosis not present

## 2020-07-05 DIAGNOSIS — M19049 Primary osteoarthritis, unspecified hand: Secondary | ICD-10-CM | POA: Diagnosis not present

## 2020-07-06 DIAGNOSIS — R609 Edema, unspecified: Secondary | ICD-10-CM | POA: Diagnosis not present

## 2020-07-06 DIAGNOSIS — I10 Essential (primary) hypertension: Secondary | ICD-10-CM | POA: Diagnosis not present

## 2020-07-06 DIAGNOSIS — E1169 Type 2 diabetes mellitus with other specified complication: Secondary | ICD-10-CM | POA: Diagnosis not present

## 2020-07-10 DIAGNOSIS — M12811 Other specific arthropathies, not elsewhere classified, right shoulder: Secondary | ICD-10-CM | POA: Diagnosis not present

## 2020-07-10 DIAGNOSIS — S7292XD Unspecified fracture of left femur, subsequent encounter for closed fracture with routine healing: Secondary | ICD-10-CM | POA: Diagnosis not present

## 2020-07-10 DIAGNOSIS — I872 Venous insufficiency (chronic) (peripheral): Secondary | ICD-10-CM | POA: Diagnosis not present

## 2020-07-10 DIAGNOSIS — I10 Essential (primary) hypertension: Secondary | ICD-10-CM | POA: Diagnosis not present

## 2020-07-10 DIAGNOSIS — E1169 Type 2 diabetes mellitus with other specified complication: Secondary | ICD-10-CM | POA: Diagnosis not present

## 2020-07-10 DIAGNOSIS — M19049 Primary osteoarthritis, unspecified hand: Secondary | ICD-10-CM | POA: Diagnosis not present

## 2020-07-10 DIAGNOSIS — J4520 Mild intermittent asthma, uncomplicated: Secondary | ICD-10-CM | POA: Diagnosis not present

## 2020-07-10 DIAGNOSIS — M25562 Pain in left knee: Secondary | ICD-10-CM | POA: Diagnosis not present

## 2020-07-10 DIAGNOSIS — M7501 Adhesive capsulitis of right shoulder: Secondary | ICD-10-CM | POA: Diagnosis not present

## 2020-07-12 DIAGNOSIS — E1169 Type 2 diabetes mellitus with other specified complication: Secondary | ICD-10-CM | POA: Diagnosis not present

## 2020-07-12 DIAGNOSIS — M12811 Other specific arthropathies, not elsewhere classified, right shoulder: Secondary | ICD-10-CM | POA: Diagnosis not present

## 2020-07-12 DIAGNOSIS — J4520 Mild intermittent asthma, uncomplicated: Secondary | ICD-10-CM | POA: Diagnosis not present

## 2020-07-12 DIAGNOSIS — M7501 Adhesive capsulitis of right shoulder: Secondary | ICD-10-CM | POA: Diagnosis not present

## 2020-07-12 DIAGNOSIS — I10 Essential (primary) hypertension: Secondary | ICD-10-CM | POA: Diagnosis not present

## 2020-07-12 DIAGNOSIS — I872 Venous insufficiency (chronic) (peripheral): Secondary | ICD-10-CM | POA: Diagnosis not present

## 2020-07-12 DIAGNOSIS — M25562 Pain in left knee: Secondary | ICD-10-CM | POA: Diagnosis not present

## 2020-07-12 DIAGNOSIS — S7292XD Unspecified fracture of left femur, subsequent encounter for closed fracture with routine healing: Secondary | ICD-10-CM | POA: Diagnosis not present

## 2020-07-12 DIAGNOSIS — M19049 Primary osteoarthritis, unspecified hand: Secondary | ICD-10-CM | POA: Diagnosis not present

## 2020-07-18 DIAGNOSIS — M25562 Pain in left knee: Secondary | ICD-10-CM | POA: Diagnosis not present

## 2020-07-18 DIAGNOSIS — S7292XD Unspecified fracture of left femur, subsequent encounter for closed fracture with routine healing: Secondary | ICD-10-CM | POA: Diagnosis not present

## 2020-07-19 DIAGNOSIS — I10 Essential (primary) hypertension: Secondary | ICD-10-CM | POA: Diagnosis not present

## 2020-07-19 DIAGNOSIS — M25562 Pain in left knee: Secondary | ICD-10-CM | POA: Diagnosis not present

## 2020-07-19 DIAGNOSIS — M19049 Primary osteoarthritis, unspecified hand: Secondary | ICD-10-CM | POA: Diagnosis not present

## 2020-07-19 DIAGNOSIS — E1169 Type 2 diabetes mellitus with other specified complication: Secondary | ICD-10-CM | POA: Diagnosis not present

## 2020-07-19 DIAGNOSIS — S7292XD Unspecified fracture of left femur, subsequent encounter for closed fracture with routine healing: Secondary | ICD-10-CM | POA: Diagnosis not present

## 2020-07-19 DIAGNOSIS — J4520 Mild intermittent asthma, uncomplicated: Secondary | ICD-10-CM | POA: Diagnosis not present

## 2020-07-19 DIAGNOSIS — I872 Venous insufficiency (chronic) (peripheral): Secondary | ICD-10-CM | POA: Diagnosis not present

## 2020-07-19 DIAGNOSIS — M12811 Other specific arthropathies, not elsewhere classified, right shoulder: Secondary | ICD-10-CM | POA: Diagnosis not present

## 2020-07-19 DIAGNOSIS — M7501 Adhesive capsulitis of right shoulder: Secondary | ICD-10-CM | POA: Diagnosis not present

## 2020-07-24 ENCOUNTER — Ambulatory Visit: Payer: Medicare PPO | Attending: Critical Care Medicine

## 2020-07-24 DIAGNOSIS — I10 Essential (primary) hypertension: Secondary | ICD-10-CM | POA: Diagnosis not present

## 2020-07-24 DIAGNOSIS — E1169 Type 2 diabetes mellitus with other specified complication: Secondary | ICD-10-CM | POA: Diagnosis not present

## 2020-07-24 DIAGNOSIS — M8580 Other specified disorders of bone density and structure, unspecified site: Secondary | ICD-10-CM | POA: Diagnosis not present

## 2020-07-24 DIAGNOSIS — M19049 Primary osteoarthritis, unspecified hand: Secondary | ICD-10-CM | POA: Diagnosis not present

## 2020-07-24 DIAGNOSIS — E7800 Pure hypercholesterolemia, unspecified: Secondary | ICD-10-CM | POA: Diagnosis not present

## 2020-07-24 DIAGNOSIS — F4321 Adjustment disorder with depressed mood: Secondary | ICD-10-CM | POA: Diagnosis not present

## 2020-07-24 DIAGNOSIS — H409 Unspecified glaucoma: Secondary | ICD-10-CM | POA: Diagnosis not present

## 2020-07-24 DIAGNOSIS — Z23 Encounter for immunization: Secondary | ICD-10-CM

## 2020-07-24 DIAGNOSIS — J4520 Mild intermittent asthma, uncomplicated: Secondary | ICD-10-CM | POA: Diagnosis not present

## 2020-07-24 NOTE — Progress Notes (Signed)
   Covid-19 Vaccination Clinic  Name:  Cheryl Quinn    MRN: [id number] DOB: [birth year]  07/24/2020  [name] was observed post Covid-19 immunization for 15 minutes without incident. She was provided with Vaccine Information Sheet and instruction to access the V-Safe system.   [name] was instructed to call 911 with any severe reactions post vaccine: Marland Kitchen Difficulty breathing  . Swelling of face and throat  . A fast heartbeat  . A bad rash all over body  . Dizziness and weakness

## 2020-07-25 DIAGNOSIS — M19049 Primary osteoarthritis, unspecified hand: Secondary | ICD-10-CM | POA: Diagnosis not present

## 2020-07-25 DIAGNOSIS — M7501 Adhesive capsulitis of right shoulder: Secondary | ICD-10-CM | POA: Diagnosis not present

## 2020-07-25 DIAGNOSIS — I872 Venous insufficiency (chronic) (peripheral): Secondary | ICD-10-CM | POA: Diagnosis not present

## 2020-07-25 DIAGNOSIS — M12811 Other specific arthropathies, not elsewhere classified, right shoulder: Secondary | ICD-10-CM | POA: Diagnosis not present

## 2020-07-25 DIAGNOSIS — E1169 Type 2 diabetes mellitus with other specified complication: Secondary | ICD-10-CM | POA: Diagnosis not present

## 2020-07-25 DIAGNOSIS — J4520 Mild intermittent asthma, uncomplicated: Secondary | ICD-10-CM | POA: Diagnosis not present

## 2020-07-25 DIAGNOSIS — M25562 Pain in left knee: Secondary | ICD-10-CM | POA: Diagnosis not present

## 2020-07-25 DIAGNOSIS — I10 Essential (primary) hypertension: Secondary | ICD-10-CM | POA: Diagnosis not present

## 2020-07-25 DIAGNOSIS — S7292XD Unspecified fracture of left femur, subsequent encounter for closed fracture with routine healing: Secondary | ICD-10-CM | POA: Diagnosis not present

## 2020-07-31 DIAGNOSIS — M19049 Primary osteoarthritis, unspecified hand: Secondary | ICD-10-CM | POA: Diagnosis not present

## 2020-07-31 DIAGNOSIS — S7292XD Unspecified fracture of left femur, subsequent encounter for closed fracture with routine healing: Secondary | ICD-10-CM | POA: Diagnosis not present

## 2020-07-31 DIAGNOSIS — M25562 Pain in left knee: Secondary | ICD-10-CM | POA: Diagnosis not present

## 2020-07-31 DIAGNOSIS — I10 Essential (primary) hypertension: Secondary | ICD-10-CM | POA: Diagnosis not present

## 2020-07-31 DIAGNOSIS — I872 Venous insufficiency (chronic) (peripheral): Secondary | ICD-10-CM | POA: Diagnosis not present

## 2020-07-31 DIAGNOSIS — E1169 Type 2 diabetes mellitus with other specified complication: Secondary | ICD-10-CM | POA: Diagnosis not present

## 2020-07-31 DIAGNOSIS — M12811 Other specific arthropathies, not elsewhere classified, right shoulder: Secondary | ICD-10-CM | POA: Diagnosis not present

## 2020-07-31 DIAGNOSIS — M7501 Adhesive capsulitis of right shoulder: Secondary | ICD-10-CM | POA: Diagnosis not present

## 2020-07-31 DIAGNOSIS — J4520 Mild intermittent asthma, uncomplicated: Secondary | ICD-10-CM | POA: Diagnosis not present

## 2020-08-04 DIAGNOSIS — I10 Essential (primary) hypertension: Secondary | ICD-10-CM | POA: Diagnosis not present

## 2020-08-04 DIAGNOSIS — M7501 Adhesive capsulitis of right shoulder: Secondary | ICD-10-CM | POA: Diagnosis not present

## 2020-08-04 DIAGNOSIS — M12811 Other specific arthropathies, not elsewhere classified, right shoulder: Secondary | ICD-10-CM | POA: Diagnosis not present

## 2020-08-04 DIAGNOSIS — M19049 Primary osteoarthritis, unspecified hand: Secondary | ICD-10-CM | POA: Diagnosis not present

## 2020-08-04 DIAGNOSIS — S7292XD Unspecified fracture of left femur, subsequent encounter for closed fracture with routine healing: Secondary | ICD-10-CM | POA: Diagnosis not present

## 2020-08-04 DIAGNOSIS — M25562 Pain in left knee: Secondary | ICD-10-CM | POA: Diagnosis not present

## 2020-08-04 DIAGNOSIS — E1169 Type 2 diabetes mellitus with other specified complication: Secondary | ICD-10-CM | POA: Diagnosis not present

## 2020-08-04 DIAGNOSIS — I872 Venous insufficiency (chronic) (peripheral): Secondary | ICD-10-CM | POA: Diagnosis not present

## 2020-08-04 DIAGNOSIS — J4520 Mild intermittent asthma, uncomplicated: Secondary | ICD-10-CM | POA: Diagnosis not present

## 2020-08-15 DIAGNOSIS — M25562 Pain in left knee: Secondary | ICD-10-CM | POA: Diagnosis not present

## 2020-08-15 DIAGNOSIS — S7292XD Unspecified fracture of left femur, subsequent encounter for closed fracture with routine healing: Secondary | ICD-10-CM | POA: Diagnosis not present

## 2020-08-16 DIAGNOSIS — E1169 Type 2 diabetes mellitus with other specified complication: Secondary | ICD-10-CM | POA: Diagnosis not present

## 2020-08-16 DIAGNOSIS — S7292XD Unspecified fracture of left femur, subsequent encounter for closed fracture with routine healing: Secondary | ICD-10-CM | POA: Diagnosis not present

## 2020-08-16 DIAGNOSIS — I872 Venous insufficiency (chronic) (peripheral): Secondary | ICD-10-CM | POA: Diagnosis not present

## 2020-08-16 DIAGNOSIS — J4520 Mild intermittent asthma, uncomplicated: Secondary | ICD-10-CM | POA: Diagnosis not present

## 2020-08-16 DIAGNOSIS — M25562 Pain in left knee: Secondary | ICD-10-CM | POA: Diagnosis not present

## 2020-08-16 DIAGNOSIS — M7501 Adhesive capsulitis of right shoulder: Secondary | ICD-10-CM | POA: Diagnosis not present

## 2020-08-16 DIAGNOSIS — I10 Essential (primary) hypertension: Secondary | ICD-10-CM | POA: Diagnosis not present

## 2020-08-16 DIAGNOSIS — M19049 Primary osteoarthritis, unspecified hand: Secondary | ICD-10-CM | POA: Diagnosis not present

## 2020-08-16 DIAGNOSIS — M12811 Other specific arthropathies, not elsewhere classified, right shoulder: Secondary | ICD-10-CM | POA: Diagnosis not present

## 2020-08-22 DIAGNOSIS — M19049 Primary osteoarthritis, unspecified hand: Secondary | ICD-10-CM | POA: Diagnosis not present

## 2020-08-22 DIAGNOSIS — M12811 Other specific arthropathies, not elsewhere classified, right shoulder: Secondary | ICD-10-CM | POA: Diagnosis not present

## 2020-08-22 DIAGNOSIS — M7501 Adhesive capsulitis of right shoulder: Secondary | ICD-10-CM | POA: Diagnosis not present

## 2020-08-22 DIAGNOSIS — I10 Essential (primary) hypertension: Secondary | ICD-10-CM | POA: Diagnosis not present

## 2020-08-22 DIAGNOSIS — M25562 Pain in left knee: Secondary | ICD-10-CM | POA: Diagnosis not present

## 2020-08-22 DIAGNOSIS — S7292XD Unspecified fracture of left femur, subsequent encounter for closed fracture with routine healing: Secondary | ICD-10-CM | POA: Diagnosis not present

## 2020-08-22 DIAGNOSIS — E1169 Type 2 diabetes mellitus with other specified complication: Secondary | ICD-10-CM | POA: Diagnosis not present

## 2020-08-22 DIAGNOSIS — J4520 Mild intermittent asthma, uncomplicated: Secondary | ICD-10-CM | POA: Diagnosis not present

## 2020-08-22 DIAGNOSIS — I872 Venous insufficiency (chronic) (peripheral): Secondary | ICD-10-CM | POA: Diagnosis not present

## 2020-08-28 DIAGNOSIS — S7292XD Unspecified fracture of left femur, subsequent encounter for closed fracture with routine healing: Secondary | ICD-10-CM | POA: Diagnosis not present

## 2020-08-28 DIAGNOSIS — I10 Essential (primary) hypertension: Secondary | ICD-10-CM | POA: Diagnosis not present

## 2020-08-28 DIAGNOSIS — M12811 Other specific arthropathies, not elsewhere classified, right shoulder: Secondary | ICD-10-CM | POA: Diagnosis not present

## 2020-08-28 DIAGNOSIS — J4520 Mild intermittent asthma, uncomplicated: Secondary | ICD-10-CM | POA: Diagnosis not present

## 2020-08-28 DIAGNOSIS — M25562 Pain in left knee: Secondary | ICD-10-CM | POA: Diagnosis not present

## 2020-08-28 DIAGNOSIS — E1169 Type 2 diabetes mellitus with other specified complication: Secondary | ICD-10-CM | POA: Diagnosis not present

## 2020-08-28 DIAGNOSIS — I872 Venous insufficiency (chronic) (peripheral): Secondary | ICD-10-CM | POA: Diagnosis not present

## 2020-08-28 DIAGNOSIS — M19049 Primary osteoarthritis, unspecified hand: Secondary | ICD-10-CM | POA: Diagnosis not present

## 2020-08-28 DIAGNOSIS — M7501 Adhesive capsulitis of right shoulder: Secondary | ICD-10-CM | POA: Diagnosis not present

## 2020-08-29 DIAGNOSIS — H401113 Primary open-angle glaucoma, right eye, severe stage: Secondary | ICD-10-CM | POA: Diagnosis not present

## 2020-08-29 DIAGNOSIS — H52203 Unspecified astigmatism, bilateral: Secondary | ICD-10-CM | POA: Diagnosis not present

## 2020-08-29 DIAGNOSIS — E119 Type 2 diabetes mellitus without complications: Secondary | ICD-10-CM | POA: Diagnosis not present

## 2020-08-29 DIAGNOSIS — Z961 Presence of intraocular lens: Secondary | ICD-10-CM | POA: Diagnosis not present

## 2020-09-03 DIAGNOSIS — S7292XD Unspecified fracture of left femur, subsequent encounter for closed fracture with routine healing: Secondary | ICD-10-CM | POA: Diagnosis not present

## 2020-09-03 DIAGNOSIS — M19049 Primary osteoarthritis, unspecified hand: Secondary | ICD-10-CM | POA: Diagnosis not present

## 2020-09-03 DIAGNOSIS — I872 Venous insufficiency (chronic) (peripheral): Secondary | ICD-10-CM | POA: Diagnosis not present

## 2020-09-03 DIAGNOSIS — M25562 Pain in left knee: Secondary | ICD-10-CM | POA: Diagnosis not present

## 2020-09-03 DIAGNOSIS — E1169 Type 2 diabetes mellitus with other specified complication: Secondary | ICD-10-CM | POA: Diagnosis not present

## 2020-09-03 DIAGNOSIS — I10 Essential (primary) hypertension: Secondary | ICD-10-CM | POA: Diagnosis not present

## 2020-09-03 DIAGNOSIS — M7501 Adhesive capsulitis of right shoulder: Secondary | ICD-10-CM | POA: Diagnosis not present

## 2020-09-03 DIAGNOSIS — M12811 Other specific arthropathies, not elsewhere classified, right shoulder: Secondary | ICD-10-CM | POA: Diagnosis not present

## 2020-09-03 DIAGNOSIS — J4520 Mild intermittent asthma, uncomplicated: Secondary | ICD-10-CM | POA: Diagnosis not present

## 2020-09-07 DIAGNOSIS — M19049 Primary osteoarthritis, unspecified hand: Secondary | ICD-10-CM | POA: Diagnosis not present

## 2020-09-07 DIAGNOSIS — E1169 Type 2 diabetes mellitus with other specified complication: Secondary | ICD-10-CM | POA: Diagnosis not present

## 2020-09-07 DIAGNOSIS — M25562 Pain in left knee: Secondary | ICD-10-CM | POA: Diagnosis not present

## 2020-09-07 DIAGNOSIS — S7292XD Unspecified fracture of left femur, subsequent encounter for closed fracture with routine healing: Secondary | ICD-10-CM | POA: Diagnosis not present

## 2020-09-07 DIAGNOSIS — M12811 Other specific arthropathies, not elsewhere classified, right shoulder: Secondary | ICD-10-CM | POA: Diagnosis not present

## 2020-09-07 DIAGNOSIS — M7501 Adhesive capsulitis of right shoulder: Secondary | ICD-10-CM | POA: Diagnosis not present

## 2020-09-07 DIAGNOSIS — J4520 Mild intermittent asthma, uncomplicated: Secondary | ICD-10-CM | POA: Diagnosis not present

## 2020-09-07 DIAGNOSIS — I872 Venous insufficiency (chronic) (peripheral): Secondary | ICD-10-CM | POA: Diagnosis not present

## 2020-09-07 DIAGNOSIS — I10 Essential (primary) hypertension: Secondary | ICD-10-CM | POA: Diagnosis not present

## 2020-09-14 DIAGNOSIS — J4520 Mild intermittent asthma, uncomplicated: Secondary | ICD-10-CM | POA: Diagnosis not present

## 2020-09-14 DIAGNOSIS — M25562 Pain in left knee: Secondary | ICD-10-CM | POA: Diagnosis not present

## 2020-09-14 DIAGNOSIS — M19049 Primary osteoarthritis, unspecified hand: Secondary | ICD-10-CM | POA: Diagnosis not present

## 2020-09-14 DIAGNOSIS — M7501 Adhesive capsulitis of right shoulder: Secondary | ICD-10-CM | POA: Diagnosis not present

## 2020-09-14 DIAGNOSIS — I10 Essential (primary) hypertension: Secondary | ICD-10-CM | POA: Diagnosis not present

## 2020-09-14 DIAGNOSIS — S7292XD Unspecified fracture of left femur, subsequent encounter for closed fracture with routine healing: Secondary | ICD-10-CM | POA: Diagnosis not present

## 2020-09-14 DIAGNOSIS — M12811 Other specific arthropathies, not elsewhere classified, right shoulder: Secondary | ICD-10-CM | POA: Diagnosis not present

## 2020-09-14 DIAGNOSIS — I872 Venous insufficiency (chronic) (peripheral): Secondary | ICD-10-CM | POA: Diagnosis not present

## 2020-09-14 DIAGNOSIS — E1169 Type 2 diabetes mellitus with other specified complication: Secondary | ICD-10-CM | POA: Diagnosis not present

## 2020-09-20 DIAGNOSIS — M7501 Adhesive capsulitis of right shoulder: Secondary | ICD-10-CM | POA: Diagnosis not present

## 2020-09-20 DIAGNOSIS — S7292XD Unspecified fracture of left femur, subsequent encounter for closed fracture with routine healing: Secondary | ICD-10-CM | POA: Diagnosis not present

## 2020-09-20 DIAGNOSIS — M19049 Primary osteoarthritis, unspecified hand: Secondary | ICD-10-CM | POA: Diagnosis not present

## 2020-09-20 DIAGNOSIS — I10 Essential (primary) hypertension: Secondary | ICD-10-CM | POA: Diagnosis not present

## 2020-09-20 DIAGNOSIS — I872 Venous insufficiency (chronic) (peripheral): Secondary | ICD-10-CM | POA: Diagnosis not present

## 2020-09-20 DIAGNOSIS — M25562 Pain in left knee: Secondary | ICD-10-CM | POA: Diagnosis not present

## 2020-09-20 DIAGNOSIS — E1169 Type 2 diabetes mellitus with other specified complication: Secondary | ICD-10-CM | POA: Diagnosis not present

## 2020-09-20 DIAGNOSIS — M12811 Other specific arthropathies, not elsewhere classified, right shoulder: Secondary | ICD-10-CM | POA: Diagnosis not present

## 2020-09-20 DIAGNOSIS — J4520 Mild intermittent asthma, uncomplicated: Secondary | ICD-10-CM | POA: Diagnosis not present

## 2020-09-21 DIAGNOSIS — S7292XD Unspecified fracture of left femur, subsequent encounter for closed fracture with routine healing: Secondary | ICD-10-CM | POA: Diagnosis not present

## 2020-09-24 DIAGNOSIS — M25562 Pain in left knee: Secondary | ICD-10-CM | POA: Diagnosis not present

## 2020-09-24 DIAGNOSIS — J4520 Mild intermittent asthma, uncomplicated: Secondary | ICD-10-CM | POA: Diagnosis not present

## 2020-09-24 DIAGNOSIS — M7501 Adhesive capsulitis of right shoulder: Secondary | ICD-10-CM | POA: Diagnosis not present

## 2020-09-24 DIAGNOSIS — E1169 Type 2 diabetes mellitus with other specified complication: Secondary | ICD-10-CM | POA: Diagnosis not present

## 2020-09-24 DIAGNOSIS — M19049 Primary osteoarthritis, unspecified hand: Secondary | ICD-10-CM | POA: Diagnosis not present

## 2020-09-24 DIAGNOSIS — I10 Essential (primary) hypertension: Secondary | ICD-10-CM | POA: Diagnosis not present

## 2020-09-24 DIAGNOSIS — M12811 Other specific arthropathies, not elsewhere classified, right shoulder: Secondary | ICD-10-CM | POA: Diagnosis not present

## 2020-09-24 DIAGNOSIS — I872 Venous insufficiency (chronic) (peripheral): Secondary | ICD-10-CM | POA: Diagnosis not present

## 2020-09-24 DIAGNOSIS — S7292XD Unspecified fracture of left femur, subsequent encounter for closed fracture with routine healing: Secondary | ICD-10-CM | POA: Diagnosis not present

## 2020-10-08 DIAGNOSIS — D692 Other nonthrombocytopenic purpura: Secondary | ICD-10-CM | POA: Diagnosis not present

## 2020-10-08 DIAGNOSIS — I788 Other diseases of capillaries: Secondary | ICD-10-CM | POA: Diagnosis not present

## 2020-10-08 DIAGNOSIS — D1801 Hemangioma of skin and subcutaneous tissue: Secondary | ICD-10-CM | POA: Diagnosis not present

## 2020-10-08 DIAGNOSIS — Z85828 Personal history of other malignant neoplasm of skin: Secondary | ICD-10-CM | POA: Diagnosis not present

## 2020-10-08 DIAGNOSIS — L720 Epidermal cyst: Secondary | ICD-10-CM | POA: Diagnosis not present

## 2020-10-08 DIAGNOSIS — C44519 Basal cell carcinoma of skin of other part of trunk: Secondary | ICD-10-CM | POA: Diagnosis not present

## 2020-10-08 DIAGNOSIS — L821 Other seborrheic keratosis: Secondary | ICD-10-CM | POA: Diagnosis not present

## 2020-10-08 DIAGNOSIS — L570 Actinic keratosis: Secondary | ICD-10-CM | POA: Diagnosis not present

## 2020-10-08 DIAGNOSIS — D2261 Melanocytic nevi of right upper limb, including shoulder: Secondary | ICD-10-CM | POA: Diagnosis not present

## 2020-10-08 DIAGNOSIS — L820 Inflamed seborrheic keratosis: Secondary | ICD-10-CM | POA: Diagnosis not present

## 2020-10-10 DIAGNOSIS — M8580 Other specified disorders of bone density and structure, unspecified site: Secondary | ICD-10-CM | POA: Diagnosis not present

## 2020-10-10 DIAGNOSIS — M19049 Primary osteoarthritis, unspecified hand: Secondary | ICD-10-CM | POA: Diagnosis not present

## 2020-10-10 DIAGNOSIS — E7800 Pure hypercholesterolemia, unspecified: Secondary | ICD-10-CM | POA: Diagnosis not present

## 2020-10-10 DIAGNOSIS — K219 Gastro-esophageal reflux disease without esophagitis: Secondary | ICD-10-CM | POA: Diagnosis not present

## 2020-10-10 DIAGNOSIS — J4520 Mild intermittent asthma, uncomplicated: Secondary | ICD-10-CM | POA: Diagnosis not present

## 2020-10-10 DIAGNOSIS — H409 Unspecified glaucoma: Secondary | ICD-10-CM | POA: Diagnosis not present

## 2020-10-10 DIAGNOSIS — E1169 Type 2 diabetes mellitus with other specified complication: Secondary | ICD-10-CM | POA: Diagnosis not present

## 2020-10-10 DIAGNOSIS — F4321 Adjustment disorder with depressed mood: Secondary | ICD-10-CM | POA: Diagnosis not present

## 2020-10-10 DIAGNOSIS — I10 Essential (primary) hypertension: Secondary | ICD-10-CM | POA: Diagnosis not present

## 2020-10-31 DIAGNOSIS — S7292XD Unspecified fracture of left femur, subsequent encounter for closed fracture with routine healing: Secondary | ICD-10-CM | POA: Diagnosis not present

## 2020-11-09 ENCOUNTER — Ambulatory Visit
Admission: RE | Admit: 2020-11-09 | Discharge: 2020-11-09 | Disposition: A | Discharge status: Home / Self Care | Payer: Medicare PPO | Source: Ambulatory Visit | Attending: Internal Medicine | Admitting: Internal Medicine

## 2020-11-09 ENCOUNTER — Other Ambulatory Visit: Payer: Self-pay

## 2020-11-09 DIAGNOSIS — Z1231 Encounter for screening mammogram for malignant neoplasm of breast: Secondary | ICD-10-CM | POA: Diagnosis not present

## 2020-12-03 DIAGNOSIS — Z0000 Encounter for general adult medical examination without abnormal findings: Secondary | ICD-10-CM | POA: Diagnosis not present

## 2020-12-03 DIAGNOSIS — I10 Essential (primary) hypertension: Secondary | ICD-10-CM | POA: Diagnosis not present

## 2020-12-03 DIAGNOSIS — Z1389 Encounter for screening for other disorder: Secondary | ICD-10-CM | POA: Diagnosis not present

## 2020-12-03 DIAGNOSIS — Z7189 Other specified counseling: Secondary | ICD-10-CM | POA: Diagnosis not present

## 2020-12-03 DIAGNOSIS — E7800 Pure hypercholesterolemia, unspecified: Secondary | ICD-10-CM | POA: Diagnosis not present

## 2020-12-03 DIAGNOSIS — K219 Gastro-esophageal reflux disease without esophagitis: Secondary | ICD-10-CM | POA: Diagnosis not present

## 2020-12-03 DIAGNOSIS — E1169 Type 2 diabetes mellitus with other specified complication: Secondary | ICD-10-CM | POA: Diagnosis not present

## 2020-12-03 DIAGNOSIS — Z8781 Personal history of (healed) traumatic fracture: Secondary | ICD-10-CM | POA: Diagnosis not present

## 2021-03-06 DIAGNOSIS — H401122 Primary open-angle glaucoma, left eye, moderate stage: Secondary | ICD-10-CM | POA: Diagnosis not present

## 2021-03-06 DIAGNOSIS — H401113 Primary open-angle glaucoma, right eye, severe stage: Secondary | ICD-10-CM | POA: Diagnosis not present

## 2021-03-26 DIAGNOSIS — E1169 Type 2 diabetes mellitus with other specified complication: Secondary | ICD-10-CM | POA: Diagnosis not present

## 2021-03-26 DIAGNOSIS — R0789 Other chest pain: Secondary | ICD-10-CM | POA: Diagnosis not present

## 2021-03-26 DIAGNOSIS — F320 Major depressive disorder, single episode, mild: Secondary | ICD-10-CM | POA: Diagnosis not present

## 2021-03-26 DIAGNOSIS — I10 Essential (primary) hypertension: Secondary | ICD-10-CM | POA: Diagnosis not present

## 2021-04-25 DIAGNOSIS — R221 Localized swelling, mass and lump, neck: Secondary | ICD-10-CM | POA: Diagnosis not present

## 2021-04-25 DIAGNOSIS — F325 Major depressive disorder, single episode, in full remission: Secondary | ICD-10-CM | POA: Diagnosis not present

## 2021-05-30 DIAGNOSIS — M19049 Primary osteoarthritis, unspecified hand: Secondary | ICD-10-CM | POA: Diagnosis not present

## 2021-05-30 DIAGNOSIS — H409 Unspecified glaucoma: Secondary | ICD-10-CM | POA: Diagnosis not present

## 2021-05-30 DIAGNOSIS — E7800 Pure hypercholesterolemia, unspecified: Secondary | ICD-10-CM | POA: Diagnosis not present

## 2021-05-30 DIAGNOSIS — M8580 Other specified disorders of bone density and structure, unspecified site: Secondary | ICD-10-CM | POA: Diagnosis not present

## 2021-05-30 DIAGNOSIS — I10 Essential (primary) hypertension: Secondary | ICD-10-CM | POA: Diagnosis not present

## 2021-05-30 DIAGNOSIS — E1169 Type 2 diabetes mellitus with other specified complication: Secondary | ICD-10-CM | POA: Diagnosis not present

## 2021-05-30 DIAGNOSIS — K219 Gastro-esophageal reflux disease without esophagitis: Secondary | ICD-10-CM | POA: Diagnosis not present

## 2021-05-30 DIAGNOSIS — J4520 Mild intermittent asthma, uncomplicated: Secondary | ICD-10-CM | POA: Diagnosis not present

## 2021-05-30 DIAGNOSIS — F4321 Adjustment disorder with depressed mood: Secondary | ICD-10-CM | POA: Diagnosis not present

## 2021-07-31 DIAGNOSIS — F419 Anxiety disorder, unspecified: Secondary | ICD-10-CM | POA: Diagnosis not present

## 2021-07-31 DIAGNOSIS — H409 Unspecified glaucoma: Secondary | ICD-10-CM | POA: Diagnosis not present

## 2021-07-31 DIAGNOSIS — G8929 Other chronic pain: Secondary | ICD-10-CM | POA: Diagnosis not present

## 2021-07-31 DIAGNOSIS — J45909 Unspecified asthma, uncomplicated: Secondary | ICD-10-CM | POA: Diagnosis not present

## 2021-07-31 DIAGNOSIS — F325 Major depressive disorder, single episode, in full remission: Secondary | ICD-10-CM | POA: Diagnosis not present

## 2021-07-31 DIAGNOSIS — K219 Gastro-esophageal reflux disease without esophagitis: Secondary | ICD-10-CM | POA: Diagnosis not present

## 2021-07-31 DIAGNOSIS — E119 Type 2 diabetes mellitus without complications: Secondary | ICD-10-CM | POA: Diagnosis not present

## 2021-07-31 DIAGNOSIS — E669 Obesity, unspecified: Secondary | ICD-10-CM | POA: Diagnosis not present

## 2021-07-31 DIAGNOSIS — E785 Hyperlipidemia, unspecified: Secondary | ICD-10-CM | POA: Diagnosis not present

## 2021-08-06 DIAGNOSIS — I10 Essential (primary) hypertension: Secondary | ICD-10-CM | POA: Diagnosis not present

## 2021-08-06 DIAGNOSIS — E1169 Type 2 diabetes mellitus with other specified complication: Secondary | ICD-10-CM | POA: Diagnosis not present

## 2021-08-06 DIAGNOSIS — F325 Major depressive disorder, single episode, in full remission: Secondary | ICD-10-CM | POA: Diagnosis not present

## 2021-09-17 DIAGNOSIS — H401113 Primary open-angle glaucoma, right eye, severe stage: Secondary | ICD-10-CM | POA: Diagnosis not present

## 2021-09-17 DIAGNOSIS — H401122 Primary open-angle glaucoma, left eye, moderate stage: Secondary | ICD-10-CM | POA: Diagnosis not present

## 2021-09-17 DIAGNOSIS — E119 Type 2 diabetes mellitus without complications: Secondary | ICD-10-CM | POA: Diagnosis not present

## 2021-09-17 DIAGNOSIS — H52203 Unspecified astigmatism, bilateral: Secondary | ICD-10-CM | POA: Diagnosis not present

## 2021-09-30 ENCOUNTER — Other Ambulatory Visit: Payer: Self-pay | Admitting: Internal Medicine

## 2021-09-30 DIAGNOSIS — Z1231 Encounter for screening mammogram for malignant neoplasm of breast: Secondary | ICD-10-CM

## 2021-10-03 DIAGNOSIS — K118 Other diseases of salivary glands: Secondary | ICD-10-CM | POA: Diagnosis not present

## 2021-11-11 ENCOUNTER — Ambulatory Visit: Payer: Medicare PPO

## 2021-11-29 DIAGNOSIS — M1711 Unilateral primary osteoarthritis, right knee: Secondary | ICD-10-CM | POA: Diagnosis not present

## 2021-11-29 DIAGNOSIS — M65321 Trigger finger, right index finger: Secondary | ICD-10-CM | POA: Diagnosis not present

## 2021-12-04 ENCOUNTER — Ambulatory Visit
Admission: RE | Admit: 2021-12-04 | Discharge: 2021-12-04 | Disposition: A | Discharge status: Home / Self Care | Payer: Medicare PPO | Source: Ambulatory Visit | Attending: Internal Medicine | Admitting: Internal Medicine

## 2021-12-04 DIAGNOSIS — Z1231 Encounter for screening mammogram for malignant neoplasm of breast: Secondary | ICD-10-CM | POA: Diagnosis not present

## 2021-12-06 DIAGNOSIS — M8580 Other specified disorders of bone density and structure, unspecified site: Secondary | ICD-10-CM | POA: Diagnosis not present

## 2021-12-06 DIAGNOSIS — F320 Major depressive disorder, single episode, mild: Secondary | ICD-10-CM | POA: Diagnosis not present

## 2021-12-06 DIAGNOSIS — H6122 Impacted cerumen, left ear: Secondary | ICD-10-CM | POA: Diagnosis not present

## 2021-12-06 DIAGNOSIS — E7800 Pure hypercholesterolemia, unspecified: Secondary | ICD-10-CM | POA: Diagnosis not present

## 2021-12-06 DIAGNOSIS — I10 Essential (primary) hypertension: Secondary | ICD-10-CM | POA: Diagnosis not present

## 2021-12-06 DIAGNOSIS — Z1389 Encounter for screening for other disorder: Secondary | ICD-10-CM | POA: Diagnosis not present

## 2021-12-06 DIAGNOSIS — Z0000 Encounter for general adult medical examination without abnormal findings: Secondary | ICD-10-CM | POA: Diagnosis not present

## 2021-12-06 DIAGNOSIS — E1169 Type 2 diabetes mellitus with other specified complication: Secondary | ICD-10-CM | POA: Diagnosis not present

## 2021-12-16 ENCOUNTER — Ambulatory Visit
Admission: RE | Admit: 2021-12-16 | Discharge: 2021-12-16 | Disposition: A | Discharge status: Home / Self Care | Payer: Medicare PPO | Source: Ambulatory Visit | Attending: Physician Assistant | Admitting: Physician Assistant

## 2021-12-16 ENCOUNTER — Other Ambulatory Visit: Payer: Self-pay

## 2021-12-16 ENCOUNTER — Other Ambulatory Visit: Payer: Self-pay | Admitting: Physician Assistant

## 2021-12-16 DIAGNOSIS — R220 Localized swelling, mass and lump, head: Secondary | ICD-10-CM

## 2021-12-16 DIAGNOSIS — E041 Nontoxic single thyroid nodule: Secondary | ICD-10-CM | POA: Diagnosis not present

## 2021-12-24 DIAGNOSIS — K219 Gastro-esophageal reflux disease without esophagitis: Secondary | ICD-10-CM | POA: Diagnosis not present

## 2021-12-24 DIAGNOSIS — J4520 Mild intermittent asthma, uncomplicated: Secondary | ICD-10-CM | POA: Diagnosis not present

## 2021-12-24 DIAGNOSIS — E1169 Type 2 diabetes mellitus with other specified complication: Secondary | ICD-10-CM | POA: Diagnosis not present

## 2021-12-24 DIAGNOSIS — M8580 Other specified disorders of bone density and structure, unspecified site: Secondary | ICD-10-CM | POA: Diagnosis not present

## 2021-12-24 DIAGNOSIS — M19049 Primary osteoarthritis, unspecified hand: Secondary | ICD-10-CM | POA: Diagnosis not present

## 2021-12-24 DIAGNOSIS — I10 Essential (primary) hypertension: Secondary | ICD-10-CM | POA: Diagnosis not present

## 2021-12-24 DIAGNOSIS — E7800 Pure hypercholesterolemia, unspecified: Secondary | ICD-10-CM | POA: Diagnosis not present

## 2021-12-24 DIAGNOSIS — H409 Unspecified glaucoma: Secondary | ICD-10-CM | POA: Diagnosis not present

## 2021-12-24 DIAGNOSIS — F4321 Adjustment disorder with depressed mood: Secondary | ICD-10-CM | POA: Diagnosis not present

## 2021-12-27 DIAGNOSIS — R220 Localized swelling, mass and lump, head: Secondary | ICD-10-CM | POA: Diagnosis not present

## 2021-12-30 ENCOUNTER — Other Ambulatory Visit: Payer: Self-pay | Admitting: Internal Medicine

## 2021-12-30 DIAGNOSIS — Z8739 Personal history of other diseases of the musculoskeletal system and connective tissue: Secondary | ICD-10-CM

## 2021-12-30 DIAGNOSIS — M1711 Unilateral primary osteoarthritis, right knee: Secondary | ICD-10-CM | POA: Diagnosis not present

## 2021-12-30 DIAGNOSIS — M65321 Trigger finger, right index finger: Secondary | ICD-10-CM | POA: Diagnosis not present

## 2022-01-01 ENCOUNTER — Other Ambulatory Visit: Payer: Self-pay | Admitting: Internal Medicine

## 2022-01-01 DIAGNOSIS — R220 Localized swelling, mass and lump, head: Secondary | ICD-10-CM

## 2022-01-03 ENCOUNTER — Ambulatory Visit
Admission: RE | Admit: 2022-01-03 | Discharge: 2022-01-03 | Disposition: A | Discharge status: Home / Self Care | Payer: Medicare PPO | Source: Ambulatory Visit | Attending: Internal Medicine | Admitting: Internal Medicine

## 2022-01-03 DIAGNOSIS — G458 Other transient cerebral ischemic attacks and related syndromes: Secondary | ICD-10-CM | POA: Diagnosis not present

## 2022-01-03 DIAGNOSIS — R220 Localized swelling, mass and lump, head: Secondary | ICD-10-CM

## 2022-01-03 DIAGNOSIS — I771 Stricture of artery: Secondary | ICD-10-CM | POA: Diagnosis not present

## 2022-01-03 DIAGNOSIS — E041 Nontoxic single thyroid nodule: Secondary | ICD-10-CM | POA: Diagnosis not present

## 2022-01-03 DIAGNOSIS — Z8719 Personal history of other diseases of the digestive system: Secondary | ICD-10-CM | POA: Diagnosis not present

## 2022-01-03 MED ORDER — IOPAMIDOL (ISOVUE-300) INJECTION 61%
75.0000 mL | Freq: Once | INTRAVENOUS | Status: AC | PRN
  Administered 2022-01-03: 75 mL via INTRAVENOUS

## 2022-01-06 DIAGNOSIS — R42 Dizziness and giddiness: Secondary | ICD-10-CM | POA: Diagnosis not present

## 2022-01-06 DIAGNOSIS — K115 Sialolithiasis: Secondary | ICD-10-CM | POA: Diagnosis not present

## 2022-01-06 DIAGNOSIS — M1711 Unilateral primary osteoarthritis, right knee: Secondary | ICD-10-CM | POA: Diagnosis not present

## 2022-01-13 ENCOUNTER — Other Ambulatory Visit: Payer: Medicare PPO

## 2022-01-13 DIAGNOSIS — M1711 Unilateral primary osteoarthritis, right knee: Secondary | ICD-10-CM | POA: Diagnosis not present

## 2022-01-20 DIAGNOSIS — M1711 Unilateral primary osteoarthritis, right knee: Secondary | ICD-10-CM | POA: Diagnosis not present

## 2022-01-21 DIAGNOSIS — H409 Unspecified glaucoma: Secondary | ICD-10-CM | POA: Diagnosis not present

## 2022-01-21 DIAGNOSIS — I10 Essential (primary) hypertension: Secondary | ICD-10-CM | POA: Diagnosis not present

## 2022-01-21 DIAGNOSIS — E7800 Pure hypercholesterolemia, unspecified: Secondary | ICD-10-CM | POA: Diagnosis not present

## 2022-01-21 DIAGNOSIS — E1169 Type 2 diabetes mellitus with other specified complication: Secondary | ICD-10-CM | POA: Diagnosis not present

## 2022-02-04 DIAGNOSIS — H8112 Benign paroxysmal vertigo, left ear: Secondary | ICD-10-CM | POA: Diagnosis not present

## 2022-02-04 DIAGNOSIS — K115 Sialolithiasis: Secondary | ICD-10-CM | POA: Insufficient documentation

## 2022-02-05 DIAGNOSIS — K115 Sialolithiasis: Secondary | ICD-10-CM | POA: Diagnosis not present

## 2022-02-19 DIAGNOSIS — H8111 Benign paroxysmal vertigo, right ear: Secondary | ICD-10-CM | POA: Diagnosis not present

## 2022-04-09 DIAGNOSIS — I10 Essential (primary) hypertension: Secondary | ICD-10-CM | POA: Diagnosis not present

## 2022-04-09 DIAGNOSIS — E1169 Type 2 diabetes mellitus with other specified complication: Secondary | ICD-10-CM | POA: Diagnosis not present

## 2022-04-11 DIAGNOSIS — H401122 Primary open-angle glaucoma, left eye, moderate stage: Secondary | ICD-10-CM | POA: Diagnosis not present

## 2022-04-11 DIAGNOSIS — H401113 Primary open-angle glaucoma, right eye, severe stage: Secondary | ICD-10-CM | POA: Diagnosis not present

## 2022-04-15 ENCOUNTER — Other Ambulatory Visit: Payer: Self-pay | Admitting: Physician Assistant

## 2022-04-15 ENCOUNTER — Ambulatory Visit
Admission: RE | Admit: 2022-04-15 | Discharge: 2022-04-15 | Disposition: A | Discharge status: Home / Self Care | Payer: Medicare PPO | Source: Ambulatory Visit | Attending: Physician Assistant | Admitting: Physician Assistant

## 2022-04-15 DIAGNOSIS — J189 Pneumonia, unspecified organism: Secondary | ICD-10-CM | POA: Diagnosis not present

## 2022-04-15 DIAGNOSIS — R0989 Other specified symptoms and signs involving the circulatory and respiratory systems: Secondary | ICD-10-CM | POA: Diagnosis not present

## 2022-04-15 DIAGNOSIS — R051 Acute cough: Secondary | ICD-10-CM

## 2022-04-15 DIAGNOSIS — R0602 Shortness of breath: Secondary | ICD-10-CM | POA: Diagnosis not present

## 2022-04-15 DIAGNOSIS — R059 Cough, unspecified: Secondary | ICD-10-CM | POA: Diagnosis not present

## 2022-05-06 ENCOUNTER — Other Ambulatory Visit: Payer: Self-pay | Admitting: Physician Assistant

## 2022-05-06 ENCOUNTER — Ambulatory Visit
Admission: RE | Admit: 2022-05-06 | Discharge: 2022-05-06 | Disposition: A | Discharge status: Home / Self Care | Payer: Medicare PPO | Source: Ambulatory Visit | Attending: Physician Assistant | Admitting: Physician Assistant

## 2022-05-06 DIAGNOSIS — R918 Other nonspecific abnormal finding of lung field: Secondary | ICD-10-CM | POA: Diagnosis not present

## 2022-05-06 DIAGNOSIS — J189 Pneumonia, unspecified organism: Secondary | ICD-10-CM

## 2022-05-06 DIAGNOSIS — R030 Elevated blood-pressure reading, without diagnosis of hypertension: Secondary | ICD-10-CM | POA: Diagnosis not present

## 2022-05-06 DIAGNOSIS — M47814 Spondylosis without myelopathy or radiculopathy, thoracic region: Secondary | ICD-10-CM | POA: Diagnosis not present

## 2022-05-06 DIAGNOSIS — Z8701 Personal history of pneumonia (recurrent): Secondary | ICD-10-CM | POA: Diagnosis not present

## 2022-06-04 ENCOUNTER — Ambulatory Visit
Admission: RE | Admit: 2022-06-04 | Discharge: 2022-06-04 | Disposition: A | Discharge status: Home / Self Care | Payer: Medicare PPO | Source: Ambulatory Visit | Attending: Internal Medicine | Admitting: Internal Medicine

## 2022-06-04 DIAGNOSIS — Z780 Asymptomatic menopausal state: Secondary | ICD-10-CM | POA: Diagnosis not present

## 2022-06-04 DIAGNOSIS — M85851 Other specified disorders of bone density and structure, right thigh: Secondary | ICD-10-CM | POA: Diagnosis not present

## 2022-06-04 DIAGNOSIS — Z8739 Personal history of other diseases of the musculoskeletal system and connective tissue: Secondary | ICD-10-CM

## 2022-06-05 ENCOUNTER — Other Ambulatory Visit: Payer: Self-pay | Admitting: Physician Assistant

## 2022-06-05 ENCOUNTER — Ambulatory Visit
Admission: RE | Admit: 2022-06-05 | Discharge: 2022-06-05 | Disposition: A | Discharge status: Home / Self Care | Payer: Medicare PPO | Source: Ambulatory Visit | Attending: Physician Assistant | Admitting: Physician Assistant

## 2022-06-05 DIAGNOSIS — Z8701 Personal history of pneumonia (recurrent): Secondary | ICD-10-CM | POA: Diagnosis not present

## 2022-06-05 DIAGNOSIS — J189 Pneumonia, unspecified organism: Secondary | ICD-10-CM | POA: Diagnosis not present

## 2022-06-05 DIAGNOSIS — R918 Other nonspecific abnormal finding of lung field: Secondary | ICD-10-CM | POA: Diagnosis not present

## 2022-06-25 ENCOUNTER — Other Ambulatory Visit: Payer: Self-pay | Admitting: *Deleted

## 2022-06-25 NOTE — Patient Outreach (Signed)
  Care Coordination   Initial Visit Note   06/25/2022 Name: Cheryl Quinn MRN: [id number] DOB: [birth year]  Cheryl Quinn is a 88 y.o. year old female who sees Kirby Funk, MD for primary care. I spoke with  [name] by phone today  What matters to the patients health and wellness today?  I have some lung issues that I am following up with my Dr.  Theola Sequin discussed the services of [facility]. Patient declined services.   Goals Addressed   None     SDOH assessments and interventions completed:  No     Care Coordination Interventions Activated:  Y  Care Coordination Interventions:  No, not indicated   Follow up plan: No further intervention required.   Encounter Outcome:  Pt. Refused   Gean Maidens BSN RN [facility] [phone]

## 2022-07-21 ENCOUNTER — Ambulatory Visit: Payer: Medicare PPO | Admitting: Pulmonary Disease

## 2022-07-21 ENCOUNTER — Encounter: Payer: Self-pay | Admitting: Pulmonary Disease

## 2022-07-21 VITALS — BP 132/70 | HR 80 | Temp 98.2°F | Ht 61.5 in | Wt 168.4 lb

## 2022-07-21 DIAGNOSIS — J849 Interstitial pulmonary disease, unspecified: Secondary | ICD-10-CM

## 2022-07-21 LAB — CK: Total CK: 33 U/L (ref 7–177)

## 2022-07-21 LAB — CBC WITH DIFFERENTIAL/PLATELET
Basophils Absolute: 0 10*3/uL (ref 0.0–0.1)
Basophils Relative: 0.4 % (ref 0.0–3.0)
Eosinophils Absolute: 0.1 10*3/uL (ref 0.0–0.7)
Eosinophils Relative: 0.9 % (ref 0.0–5.0)
HCT: 38.2 % (ref 36.0–46.0)
Hemoglobin: 12.7 g/dL (ref 12.0–15.0)
Lymphocytes Relative: 21.3 % (ref 12.0–46.0)
Lymphs Abs: 1.7 10*3/uL (ref 0.7–4.0)
MCHC: 33.3 g/dL (ref 30.0–36.0)
MCV: 92.7 fl (ref 78.0–100.0)
Monocytes Absolute: 0.3 10*3/uL (ref 0.1–1.0)
Monocytes Relative: 3.8 % (ref 3.0–12.0)
Neutro Abs: 6 10*3/uL (ref 1.4–7.7)
Neutrophils Relative %: 73.6 % (ref 43.0–77.0)
Platelets: 225 10*3/uL (ref 150.0–400.0)
RBC: 4.12 Mil/uL (ref 3.87–5.11)
RDW: 14.9 % (ref 11.5–15.5)
WBC: 8.2 10*3/uL (ref 4.0–10.5)

## 2022-07-21 NOTE — Patient Instructions (Addendum)
We will get a CBC differential, IgE and additional labs to look for autoimmune disease We will high-resolution CT and pulmonary function test Return to clinic in 1 to 2 months for review and plan for next steps.

## 2022-07-21 NOTE — Progress Notes (Signed)
Cheryl Quinn    [id number]    1933-03-08  Primary Care Physician:Griffin, Jonny Ruiz, MD  Referring Physician: Kirby Funk, MD [address] [place],  [place] [address]  Chief complaint: Consult for interstitial lung disease  HPI: 88 y.o. who  has a past medical history of Arthritis, Asthma, Complete rupture of left rotator cuff (09/28/2015), Diabetes mellitus without complication (HCC), GERD (gastroesophageal reflux disease), Glaucoma, Hyperlipidemia, Hypertension, and PONV (postoperative nausea and vomiting).   Referred here for evaluation of interstitial lung disease.  She had cough and shortness of breath which is worsened after she developed pneumonia seen outpatient earlier this year.  She had several chest x-rays and at least 2 rounds of antibiotics with some improvement but continues to have mild persistent symptoms.  She has history of recurrent bronchitis and asthma but not on controller medication.  She is using albuterol as needed.  History also notable for seasonal allergies and occasional GERD symptoms  Pets: No pets Occupation: Retired Occupational psychologist Exposures: Has down pillows.  No mold, hot tub, Jacuzzi. ILD questionnaire 07/21/2022-negative except for above Smoking history: Smoked 2 to 3 cigarettes a day for 45 years.  Quit in 2000 Travel history: No significant travel history Relevant family history: Sister has COPD   Outpatient Encounter Medications as of 07/21/2022  Medication Sig   acetaminophen (TYLENOL) 500 MG tablet Take by mouth.   albuterol (PROVENTIL HFA;VENTOLIN HFA) 108 (90 BASE) MCG/ACT inhaler Inhale into the lungs every 6 (six) hours as needed for wheezing or shortness of breath.   brimonidine (ALPHAGAN P) 0.1 % SOLN    CRESTOR 10 MG tablet Take 10 mg by mouth daily.   diclofenac (FLECTOR) 1.3 % PTCH 1 PATCH TO SKIN TO MOST PAINFUL AREA AS NEEDED TRANSDERMAL 30 DAY(S)   dorzolamide-timolol (COSOPT) 22.3-6.8 MG/ML ophthalmic  solution INSTILL 1 DROP INTO BOTH EYES 2 TIMES DAILY.   famotidine (PEPCID) 10 MG tablet Take 10 mg by mouth 2 (two) times daily.   fluticasone (FLONASE) 50 MCG/ACT nasal spray Place into both nostrils daily.   furosemide (LASIX) 20 MG tablet Take 20 mg by mouth daily.   KLOR-CON M10 10 MEQ tablet Take 10 mEq by mouth daily.   LUMIGAN 0.01 % SOLN    metFORMIN (GLUCOPHAGE) 500 MG tablet Take 500 mg by mouth 2 (two) times daily with a meal.   metFORMIN (GLUCOPHAGE-XR) 500 MG 24 hr tablet SMARTSIG:2 Tablet(s) By Mouth Every Evening   sennosides-docusate sodium (SENOKOT-S) 8.6-50 MG tablet Take 2 tablets by mouth daily.   sertraline (ZOLOFT) 50 MG tablet Take 50 mg by mouth daily.   meclizine (ANTIVERT) 12.5 MG tablet Take by mouth 3 (three) times daily as needed. (Patient not taking: Reported on 07/21/2022)   [DISCONTINUED] baclofen (LIORESAL) 10 MG tablet Take 1 tablet (10 mg total) by mouth 3 (three) times daily. As needed for muscle spasm (Patient not taking: Reported on 07/21/2022)   [DISCONTINUED] MOBIC 15 MG tablet Take 1 tablet by mouth once a day  Take once a day for 2 weeks then prn for pain after   [DISCONTINUED] ondansetron (ZOFRAN) 4 MG tablet Take 1 tablet (4 mg total) by mouth every 8 (eight) hours as needed for nausea or vomiting.   [DISCONTINUED] TRADJENTA 5 MG TABS tablet    [DISCONTINUED] valsartan-hydrochlorothiazide (DIOVAN-HCT) 80-12.5 MG per tablet TAKE 1 TABLET BY MOUTH EVERY DAY 90 DAYS   No facility-administered encounter medications on file as of 07/21/2022.  Allergies as of 07/21/2022 - Review Complete 07/21/2022  Allergen Reaction Noted   Oxycodone Shortness Of Breath, Nausea And Vomiting, Anxiety, and Other (See Comments) 01/14/2016   Duricef [cefadroxil] Diarrhea 05/08/2015   Lipitor [atorvastatin] Other (See Comments) 05/08/2015   Lisinopril  05/08/2015   Vicodin [hydrocodone-acetaminophen] Other (See Comments) 05/08/2015    Past Medical History:  Diagnosis  Date   Arthritis    hips,shoulders   Asthma    Complete rupture of left rotator cuff 09/28/2015   Diabetes mellitus without complication (HCC)    GERD (gastroesophageal reflux disease)    with spicy foods   Glaucoma    Hyperlipidemia    Hypertension    PONV (postoperative nausea and vomiting)     Past Surgical History:  Procedure Laterality Date   ABDOMINAL HYSTERECTOMY     BLEPHAROPLASTY     BREAST EXCISIONAL BIOPSY Right 2003   Benign   BUNIONECTOMY WITH HAMMERTOE RECONSTRUCTION Bilateral    CHOLECYSTECTOMY     EYE SURGERY     cataract   SHOULDER ARTHROSCOPY WITH ROTATOR CUFF REPAIR Left 09/28/2015   Procedure: LEFT SHOULDER ARTHROSCOPY DEBRIDEMENT, ROTATOR CUFF REPAIR;  Surgeon: Teryl Lucy, MD;  Location: [facility];  Service: Orthopedics;  Laterality: Left;   TRIGGER FINGER RELEASE Left    WRIST FRACTURE SURGERY Right     No family history on file.  Social History   Socioeconomic History   Marital status: Married    Spouse name: Not on file   Number of children: Not on file   Years of education: Not on file   Highest education level: Not on file  Occupational History   Not on file  Tobacco Use   Smoking status: Former    Types: Cigarettes   Smokeless tobacco: Never   Tobacco comments:    Smoked only 1-2 cigarettes per day  Substance and Sexual Activity   Alcohol use: Yes    Comment: social   Drug use: No   Sexual activity: Not on file  Other Topics Concern   Not on file  Social History Narrative   Not on file   Social Determinants of Health   Financial Resource Strain: Not on file  Food Insecurity: Not on file  Transportation Needs: Not on file  Physical Activity: Not on file  Stress: Not on file  Social Connections: Not on file  Intimate Partner Violence: Not on file    Review of systems: Review of Systems  Constitutional: Negative for fever and chills.  HENT: Negative.   Eyes: Negative for blurred vision.  Respiratory: as  per HPI  Cardiovascular: Negative for chest pain and palpitations.  Gastrointestinal: Negative for vomiting, diarrhea, blood per rectum. Genitourinary: Negative for dysuria, urgency, frequency and hematuria.  Musculoskeletal: Negative for myalgias, back pain and joint pain.  Skin: Negative for itching and rash.  Neurological: Negative for dizziness, tremors, focal weakness, seizures and loss of consciousness.  Endo/Heme/Allergies: Negative for environmental allergies.  Psychiatric/Behavioral: Negative for depression, suicidal ideas and hallucinations.  All other systems reviewed and are negative.  Physical Exam: Blood pressure 132/70, pulse 80, temperature 98.2 F (36.8 C), temperature source Oral, height 5' 1.5" (1.562 m), weight 168 lb 6.4 oz (76.4 kg), SpO2 95 %. Gen:      No acute distress HEENT:  EOMI, sclera anicteric Neck:     No masses; no thyromegaly Lungs:    Clear to auscultation bilaterally; normal respiratory effort CV:         Regular rate  and rhythm; no murmurs Abd:      + bowel sounds; soft, non-tender; no palpable masses, no distension Ext:    No edema; adequate peripheral perfusion Skin:      Warm and dry; no rash Neuro: alert and oriented x 3 Psych: normal mood and affect  Data Reviewed: Imaging: Chest x-ray 04/15/2022-diffuse interstitial thickening. Chest x-ray 05/06/2022-bibasal opacities Chest x-ray 06/05/2022-chronic interstitial opacities with bibasal atelectasis I reviewed the images personally  PFTs:  Labs:  Assessment:  Evaluation for interstitial lung disease She has history of recurrent bronchitis and possible asthma.  Treated for pneumonia earlier this year Her chest x-ray do show some mild interstitial changes but is not clear if there is interstitial lung disease at baseline or if this is changes of chronic bronchitis or pneumonia  Has ongoing exposure to down feather Check serologies for connective tissue disease, CBC and IgE, hypersensitivity  panel Schedule high-res CT and PFTs  Plan/Recommendations: Labs High-res CT, PFTs  Chilton Greathouse MD [facility] 07/21/2022, 9:42 AM  CC: Kirby Funk, MD

## 2022-07-23 LAB — CYCLIC CITRUL PEPTIDE ANTIBODY, IGG: Cyclic Citrullin Peptide Ab: <16 UNITS

## 2022-07-23 LAB — RHEUMATOID FACTOR: Rheumatoid fact SerPl-aCnc: 616 IU/mL — ABNORMAL HIGH (ref <14)

## 2022-07-23 LAB — ANTI-SCLERODERMA ANTIBODY: Scleroderma (Scl-70) (ENA) Antibody, IgG: <1 AI

## 2022-07-23 LAB — ALDOLASE: Aldolase: 2.8 U/L (ref <=8.1)

## 2022-07-23 LAB — SJOGREN'S SYNDROME ANTIBODS(SSA + SSB)
SSA (Ro) (ENA) Antibody, IgG: <1 AI
SSB (La) (ENA) Antibody, IgG: <1 AI

## 2022-07-23 LAB — ANTI-NUCLEAR AB-TITER (ANA TITER): ANA Titer 1: 1:80 {titer} — ABNORMAL HIGH

## 2022-07-23 LAB — ANA: Anti Nuclear Antibody (ANA): POSITIVE — AB

## 2022-07-23 LAB — IGE: IgE (Immunoglobulin E), Serum: 29 kU/L (ref <=114)

## 2022-07-23 LAB — ANTI-DNA ANTIBODY, DOUBLE-STRANDED: ds DNA Ab: <1 IU/mL

## 2022-07-25 LAB — HYPERSENSITIVITY PNEUMONITIS
A. Pullulans Abs: NEGATIVE
A.Fumigatus #1 Abs: NEGATIVE
Micropolyspora faeni, IgG: NEGATIVE
Pigeon Serum Abs: NEGATIVE
Thermoact. Saccharii: NEGATIVE
Thermoactinomyces vulgaris, IgG: NEGATIVE

## 2022-07-25 LAB — RNP ANTIBODIES: ENA RNP Ab: 0.3 AI (ref 0.0–0.9)

## 2022-08-01 ENCOUNTER — Telehealth: Payer: Self-pay | Admitting: Pulmonary Disease

## 2022-08-01 DIAGNOSIS — R768 Other specified abnormal immunological findings in serum: Secondary | ICD-10-CM

## 2022-08-01 NOTE — Telephone Encounter (Signed)
See results encounter

## 2022-08-20 DIAGNOSIS — E1169 Type 2 diabetes mellitus with other specified complication: Secondary | ICD-10-CM | POA: Diagnosis not present

## 2022-08-20 DIAGNOSIS — I10 Essential (primary) hypertension: Secondary | ICD-10-CM | POA: Diagnosis not present

## 2022-08-20 DIAGNOSIS — R9389 Abnormal findings on diagnostic imaging of other specified body structures: Secondary | ICD-10-CM | POA: Diagnosis not present

## 2022-08-26 ENCOUNTER — Ambulatory Visit
Admission: RE | Admit: 2022-08-26 | Discharge: 2022-08-26 | Disposition: A | Discharge status: Home / Self Care | Payer: Medicare PPO | Source: Ambulatory Visit | Attending: Pulmonary Disease | Admitting: Pulmonary Disease

## 2022-08-26 DIAGNOSIS — K449 Diaphragmatic hernia without obstruction or gangrene: Secondary | ICD-10-CM | POA: Diagnosis not present

## 2022-08-26 DIAGNOSIS — J849 Interstitial pulmonary disease, unspecified: Secondary | ICD-10-CM

## 2022-08-26 DIAGNOSIS — J479 Bronchiectasis, uncomplicated: Secondary | ICD-10-CM | POA: Diagnosis not present

## 2022-08-26 DIAGNOSIS — I700 Atherosclerosis of aorta: Secondary | ICD-10-CM | POA: Diagnosis not present

## 2022-08-26 DIAGNOSIS — J84112 Idiopathic pulmonary fibrosis: Secondary | ICD-10-CM | POA: Diagnosis not present

## 2022-09-05 ENCOUNTER — Encounter: Payer: Self-pay | Admitting: Podiatrist

## 2022-09-05 ENCOUNTER — Ambulatory Visit: Payer: Medicare PPO | Admitting: Podiatrist

## 2022-09-05 ENCOUNTER — Ambulatory Visit (INDEPENDENT_AMBULATORY_CARE_PROVIDER_SITE_OTHER): Payer: Medicare PPO

## 2022-09-05 DIAGNOSIS — S99921A Unspecified injury of right foot, initial encounter: Secondary | ICD-10-CM

## 2022-09-05 DIAGNOSIS — M7989 Other specified soft tissue disorders: Secondary | ICD-10-CM | POA: Diagnosis not present

## 2022-09-05 DIAGNOSIS — S92514A Nondisplaced fracture of proximal phalanx of right lesser toe(s), initial encounter for closed fracture: Secondary | ICD-10-CM

## 2022-09-05 NOTE — Progress Notes (Signed)
Chief Complaint  Patient presents with   Toe Injury     Right foot second toe pain     HPI: Patient is 88 y.o. female who presents today for pain, second toe of the right foot.  She relates she dropped a heavy bottle of laundry detergent on her foot on 09/04/22 and relates it has been painful. She hasn't been able to put any weight on it since yesterday. Relates it is painful with any pressure.    Allergies  Allergen Reactions   Oxycodone Shortness Of Breath, Nausea And Vomiting, Anxiety and Other (See Comments)    Sweating; hullicinations;    Duricef [Cefadroxil] Diarrhea   Lipitor [Atorvastatin] Other (See Comments)    Muscle weakenss   Lisinopril    Vicodin [Hydrocodone-Acetaminophen] Other (See Comments)    hallucinations    Review of systems is negative except as noted in the HPI.  Denies nausea/ vomiting/ fevers/ chills or night sweats.   Denies difficulty breathing, denies calf pain or tenderness  Physical Exam  Patient is awake, alert, and oriented x 3.  In no acute distress.    Vascular status is intact with palpable pedal pulses DP and PT bilateral and capillary refill time less than 3 seconds bilateral.  No edema or erythema noted.   Neurological exam reveals epicritic and protective sensation grossly intact bilateral.   Dermatological exam reveals skin is supple and dry to bilateral feet.  No open lesions present.    Musculoskeletal exam: pain on palpation right second toe.  No dislocation, no redness, no swelling present, no ecchymosis present.   Xrays:  3 views right foot show a fracture of the proximal phalanx of the right second toe.  Minimally displaced.  Stable.  No joint displacement no fracture within the joint space noted.  See study read.   Assessment:   ICD-10-CM   1. Injury of right foot, initial encounter  S99.921A DG Foot Complete Right    2. Closed nondisplaced fracture of proximal phalanx of lesser toe of right foot, initial encounter  S92.514A        Plan: Discussed xray findings with Keviana, recommended a post op / surgical shoe for this right foot. She will wear this consistently for 4 weeks and we will recheck her xrays/ foot in 4 weeks.

## 2022-09-05 NOTE — Patient Instructions (Signed)
Toe Fracture A toe fracture is a break in one of the toe bones (phalanges). This may happen if you: Drop a heavy object on your toe. Stub your toe. Twist your toe. Exercise the same way too much. What are the signs or symptoms? The main symptoms are swelling and pain in the toe. You may also have: Bruising. Stiffness. Numbness. A change in the way the toe looks. Broken bones that poke through the skin. Blood under the toenail. How is this treated? Treatments may include: Taping the broken toe to a toe that is next to it (buddy taping). Wearing a shoe that has a wide, rigid sole to protect the toe and to limit its movement. Wearing a cast. Surgery. This may be needed if the: Pieces of broken bone are out of place. Bone pokes through the skin. Physical therapy. Follow these instructions at home: If you have a shoe: Wear the shoe as told by your doctor. Remove it only as told by your doctor. Loosen the shoe if your toes tingle, become numb, or turn cold and blue. Keep the shoe clean and dry. If you have a cast: Do not put pressure on any part of the cast until it is fully hardened. This may take a few hours. Do not stick anything inside the cast to scratch your skin. Check the skin around the cast every day. Tell your doctor about any concerns. You may put lotion on dry skin around the edges of the cast. Do not put lotion on the skin under the cast. Keep the cast clean and dry. Bathing Do not take baths, swim, or use a hot tub until your doctor says it is okay. Ask your doctor if you can take showers. If the shoe or cast is not waterproof: Do not let it get wet. Cover it with a watertight covering when you take a bath or a shower. Activity Do not use your foot to support your body weight until your doctor says it is okay. Use crutches as told by your doctor. Ask your doctor what activities are safe for you during recovery. Avoid activities as told by your doctor. Do  exercises as told by your doctor or therapist. Driving Do not drive or use heavy machinery while taking pain medicine. Do not drive while wearing a cast on a foot that you use for driving. Managing pain, stiffness, and swelling  Put ice on the injured area if told by your doctor: Put ice in a plastic bag. Place a towel between your skin and the bag. If you have a shoe, remove it as told by your doctor. If you have a cast, place a towel between your cast and the bag. Leave the ice on for 20 minutes, 2-3 times per day. Raise (elevate) the injured area above the level of your heart while you are sitting or lying down. General instructions If your toe was taped to a toe that is next to it, follow your doctor's instructions for changing the gauze and tape. Change it more often: If the gauze and tape get wet. If this happens, dry the space between the toes. If the gauze and tape are too tight and they cause your toe to become pale or to lose feeling (go numb). If your doctor did not give you a protective shoe, wear sturdy shoes that support your foot. Your shoes should not: Pinch your toes. Fit tightly against your toes. Do not use any tobacco products, including cigarettes, chewing tobacco, or e-cigarettes.   These can delay bone healing. If you need help quitting, ask your doctor. Take medicines only as told by your doctor. Keep all follow-up visits as told by your doctor. This is important. Contact a doctor if: Your pain medicine is not helping. You have a fever. You notice a bad smell coming from your cast. Get help right away if: You lose feeling (have numbness) in your toe or foot, and it is getting worse. Your toe or your foot tingles. Your toe or your foot gets cold or turns blue. You have redness or swelling in your toe or foot, and it is getting worse. You have very bad pain. Summary A toe fracture is a break in one of the toe bones. Use ice and raise your foot. This will help  lessen pain and swelling. Use crutches as told by your doctor. This information is not intended to replace advice given to you by your health care provider. Make sure you discuss any questions you have with your health care provider. Document Revised: 04/15/2021 Document Reviewed: 04/15/2021 Elsevier Patient Education  2023 Elsevier Inc.  

## 2022-09-15 ENCOUNTER — Encounter: Payer: Self-pay | Admitting: Pulmonary Disease

## 2022-09-15 ENCOUNTER — Ambulatory Visit: Payer: Medicare PPO | Admitting: Pulmonary Disease

## 2022-09-15 ENCOUNTER — Ambulatory Visit (INDEPENDENT_AMBULATORY_CARE_PROVIDER_SITE_OTHER): Payer: Medicare PPO | Admitting: Pulmonary Disease

## 2022-09-15 VITALS — BP 112/60 | HR 93 | Temp 98.1°F | Ht 61.5 in | Wt 166.0 lb

## 2022-09-15 DIAGNOSIS — J849 Interstitial pulmonary disease, unspecified: Secondary | ICD-10-CM

## 2022-09-15 DIAGNOSIS — N289 Disorder of kidney and ureter, unspecified: Secondary | ICD-10-CM

## 2022-09-15 LAB — PULMONARY FUNCTION TEST
DL/VA % pred: 92 %
DL/VA: 3.8 ml/min/mmHg/L
DLCO cor % pred: 84 %
DLCO cor: 14.29 ml/min/mmHg
DLCO unc % pred: 84 %
DLCO unc: 14.29 ml/min/mmHg
FEF 25-75 Post: 3.76 L/sec
FEF 25-75 Pre: 2.17 L/sec
FEF2575-%Change-Post: 73 %
FEF2575-%Pred-Post: 394 %
FEF2575-%Pred-Pre: 227 %
FEV1-%Change-Post: 17 %
FEV1-%Pred-Post: 136 %
FEV1-%Pred-Pre: 116 %
FEV1-Post: 2.04 L
FEV1-Pre: 1.74 L
FEV1FVC-%Change-Post: 2 %
FEV1FVC-%Pred-Pre: 120 %
FEV6-%Change-Post: 14 %
FEV6-%Pred-Post: 119 %
FEV6-%Pred-Pre: 104 %
FEV6-Post: 2.28 L
FEV6-Pre: 1.99 L
FEV6FVC-%Pred-Post: 107 %
FEV6FVC-%Pred-Pre: 107 %
FVC-%Change-Post: 14 %
FVC-%Pred-Post: 111 %
FVC-%Pred-Pre: 97 %
FVC-Post: 2.28 L
FVC-Pre: 1.99 L
Post FEV1/FVC ratio: 90 %
Post FEV6/FVC ratio: 100 %
Pre FEV1/FVC ratio: 88 %
Pre FEV6/FVC Ratio: 100 %
RV % pred: 56 %
RV: 1.34 L
TLC % pred: 93 %
TLC: 4.37 L

## 2022-09-15 MED ORDER — BUDESONIDE-FORMOTEROL FUMARATE 160-4.5 MCG/ACT IN AERO: 2.0000 | INHALATION_SPRAY | Freq: Two times a day (BID) | RESPIRATORY_TRACT | 5 refills | Status: DC

## 2022-09-15 NOTE — Patient Instructions (Signed)
We will start you on an inhaler called Symbicort 160/4.5.  Take 2 puffs twice daily We will make a referral to urology for kidney lesion seen on CT scan Follow-up in clinic in December 2023.

## 2022-09-15 NOTE — Progress Notes (Signed)
Cheryl Quinn    [id number]    [birth year]  Primary Care Physician:Griffin, Jenny Reichmann, MD  Referring Physician: Lavone Orn, MD [address] [place],  [place] [address]  Chief complaint: Follow-up for interstitial lung disease  HPI: 88 y.o. who  has a past medical history of Arthritis, Asthma, Complete rupture of left rotator cuff (09/28/2015), Diabetes mellitus without complication ([facility]), GERD (gastroesophageal reflux disease), Glaucoma, Hyperlipidemia, Hypertension, and PONV (postoperative nausea and vomiting).   Referred here for evaluation of interstitial lung disease.  She had cough and shortness of breath which is worsened after she developed pneumonia seen outpatient earlier this year.  She had several chest x-rays and at least 2 rounds of antibiotics with some improvement but continues to have mild persistent symptoms.  She has history of recurrent bronchitis and asthma but not on controller medication.  She is using albuterol as needed.  History also notable for seasonal allergies and occasional GERD symptoms  Pets: No pets Occupation: Retired Ship broker Exposures: Has down pillows.  No mold, hot tub, Jacuzzi. ILD questionnaire 07/21/2022-negative except for above Smoking history: Smoked 2 to 3 cigarettes a day for 45 years.  Quit in 2000 Travel history: No significant travel history Relevant family history: Sister has COPD  Interim history: She is here for review of CT and PFTs.  Breathing is stable   Outpatient Encounter Medications as of 09/15/2022  Medication Sig   acetaminophen (TYLENOL) 500 MG tablet Take by mouth.   albuterol (PROVENTIL HFA;VENTOLIN HFA) 108 (90 BASE) MCG/ACT inhaler Inhale into the lungs every 6 (six) hours as needed for wheezing or shortness of breath.   brimonidine (ALPHAGAN P) 0.1 % SOLN    CRESTOR 10 MG tablet Take 10 mg by mouth daily.   diclofenac (FLECTOR) 1.3 % PTCH 1 PATCH TO SKIN TO MOST PAINFUL AREA AS NEEDED  TRANSDERMAL 30 DAY(S)   dorzolamide-timolol (COSOPT) 22.3-6.8 MG/ML ophthalmic solution INSTILL 1 DROP INTO BOTH EYES 2 TIMES DAILY.   famotidine (PEPCID) 10 MG tablet Take 10 mg by mouth 2 (two) times daily.   fluticasone (FLONASE) 50 MCG/ACT nasal spray Place into both nostrils daily.   furosemide (LASIX) 20 MG tablet Take 20 mg by mouth daily.   GLIPIZIDE PO Take by mouth.   KLOR-CON M10 10 MEQ tablet Take 10 mEq by mouth daily.   LUMIGAN 0.01 % SOLN    meclizine (ANTIVERT) 12.5 MG tablet Take by mouth 3 (three) times daily as needed.   metFORMIN (GLUCOPHAGE-XR) 500 MG 24 hr tablet SMARTSIG:2 Tablet(s) By Mouth Every Evening   sertraline (ZOLOFT) 50 MG tablet Take 50 mg by mouth daily.   [DISCONTINUED] metFORMIN (GLUCOPHAGE) 500 MG tablet Take 500 mg by mouth 2 (two) times daily with a meal.   [DISCONTINUED] sennosides-docusate sodium (SENOKOT-S) 8.6-50 MG tablet Take 2 tablets by mouth daily.   No facility-administered encounter medications on file as of 09/15/2022.   Physical Exam: Blood pressure 112/60, pulse 93, temperature 98.1 F (36.7 C), temperature source Oral, height 5' 1.5" (1.562 m), weight 166 lb (75.3 kg), SpO2 98 %. Gen:      No acute distress HEENT:  EOMI, sclera anicteric Neck:     No masses; no thyromegaly Lungs:    Clear to auscultation bilaterally; normal respiratory effort CV:         Regular rate and rhythm; no murmurs Abd:      + bowel sounds; soft, non-tender; no palpable masses, no  distension Ext:    No edema; adequate peripheral perfusion Skin:      Warm and dry; no rash Neuro: alert and oriented x 3 Psych: normal mood and affect   Data Reviewed: Imaging: Chest x-ray 04/15/2022-diffuse interstitial thickening. Chest x-ray 05/06/2022-bibasal opacities Chest x-ray 06/05/2022-chronic interstitial opacities with bibasal atelectasis High-resolution CT chest 09/02/2022-mild pulmonary fibrosis with basal gradient, septal thickening, traction bronchiectasis and  bronchiolectasis.  Probable UIP.  Kidney lesions noted. I reviewed the images personally  PFTs: 09/15/2022 FVC 2.08 [111%], FEV1 2.04 [136%], F/F 90, TLC 4.37 [93%], DLCO 14.29 [84%] Normal test, Airtrapping and bronchodilator response suggests minimal airway obstruction  Labs: CTD serologies 07/21/2022-ANA 1: 180, cytoplasmic.  Rheumatoid factor 6.6  Assessment:  Evaluation for interstitial lung disease Reviewed CT which shows mild fibrosis and probable UIP pattern.  She does have arthritis symptoms with elevated ANA and rheumatoid factor.  Noted to have ongoing exposure to down feather and we have discussed getting rid of these exposures.  We reviewed findings today and possibilities include chronic HP, RA ILD or IPF.  Will await rheumatology evaluation which is pending and will likely need a multidisciplinary ILD conference discussion Patient and husband would like to hold off treatment for now as work-up is done.  Kidney lesions Noted on recent CT chest.  Refer to urology  Asthma PFTs reviewed with trapping and bronchodilator response suggesting advair disease.  She also has trapping noted on CT scan Start Symbicort and  Plan/Recommendations: Rheumatology evaluation Referral to urology Start Symbicort  Chilton Greathouse MD [facility] 09/15/2022, 2:13 PM  CC: Kirby Funk, MD

## 2022-09-15 NOTE — Progress Notes (Signed)
Full PFT performed today. °

## 2022-09-15 NOTE — Patient Instructions (Signed)
Full PFT performed today. °

## 2022-10-09 ENCOUNTER — Ambulatory Visit (INDEPENDENT_AMBULATORY_CARE_PROVIDER_SITE_OTHER): Payer: Medicare PPO

## 2022-10-09 ENCOUNTER — Encounter: Payer: Self-pay | Admitting: Podiatrist

## 2022-10-09 ENCOUNTER — Ambulatory Visit: Payer: Medicare PPO | Admitting: Podiatrist

## 2022-10-09 DIAGNOSIS — M79674 Pain in right toe(s): Secondary | ICD-10-CM

## 2022-10-09 DIAGNOSIS — M79675 Pain in left toe(s): Secondary | ICD-10-CM

## 2022-10-09 DIAGNOSIS — S92501D Displaced unspecified fracture of right lesser toe(s), subsequent encounter for fracture with routine healing: Secondary | ICD-10-CM

## 2022-10-09 DIAGNOSIS — B351 Tinea unguium: Secondary | ICD-10-CM

## 2022-10-09 DIAGNOSIS — S99921A Unspecified injury of right foot, initial encounter: Secondary | ICD-10-CM | POA: Diagnosis not present

## 2022-10-09 NOTE — Progress Notes (Signed)
  HPI: Patient is 88 y.o. female who presents today for follow up of second toe fracture from 09/04/2022 where she dropped a gallon + bottle of laundry detergent on her toe.  She relates she has been wearing the darco shoe as instructed.  She also relates she has been using a walker for ambulation assistance.  She also states her toenails have become long and are hitting at the ends of certain shoes. She relates they can become painful when they grow too long.  She also has vertigo and gets dizzy spells at times.    Allergies  Allergen Reactions   Oxycodone Shortness Of Breath, Nausea And Vomiting, Anxiety and Other (See Comments)    Sweating; hullicinations;    Duricef [Cefadroxil] Diarrhea   Lipitor [Atorvastatin] Other (See Comments)    Muscle weakenss   Lisinopril    Vicodin [Hydrocodone-Acetaminophen] Other (See Comments)    hallucinations    Review of systems is negative except as noted in the HPI.  Denies nausea/ vomiting/ fevers/ chills or night sweats.   Denies difficulty breathing, denies calf pain or tenderness  Physical Exam  Patient is awake, alert, and oriented x 3.  In no acute distress.    Vascular status is intact with palpable pedal pulses DP and PT bilateral and capillary refill time less than 3 seconds bilateral.  No edema or erythema noted.   Neurological exam reveals epicritic and protective sensation grossly intact bilateral.   Dermatological exam reveals skin is supple and dry to bilateral feet.  Digital nails are elonaged, discolored, dystrophic and painful with direct pressure.  No redness, no bruising of the second toe right foot is noted. Rectus alignment of the toe is seen. Digital nails are , discolored, dystrophic, brittle and painful with direct pressure x 10.     Musculoskeletal exam: Musculature intact with dorsiflexion, plantarflexion, inversion, eversion. Ankle and First MPJ joint range of motion normal.   Xray = 3 views of the right foot obtained and  compared to the xrays from October.  Fracture site is present and unchanged.  No deviation of fragments noted on xray.  Some healing of the fracuture site is seen.   Assessment:   ICD-10-CM   1. Closed fracture of phalanx of right second toe with routine healing, subsequent encounter  S92.501D DG Foot Complete Right    2. Pain due to onychomycosis of toenails of both feet  B35.1    M79.675    M79.674         Plan: Exam findings and treatment recommendations are discussed.  Discussed xrays and the second toe is stable. Recommended continued use of the darco shoe.  She also requested a nail trim at todays visit.  This was carried out without complication. She will return in 4-6 weeks for further recheck of fracture-  will continue to wear darco shoe.

## 2022-10-13 ENCOUNTER — Telehealth: Payer: Self-pay | Admitting: Pulmonary Disease

## 2022-10-14 NOTE — Progress Notes (Unsigned)
Office Visit Note  Patient: Cheryl Quinn             Date of Birth: 1933/01/14           MRN: [id number]             PCP: Kirby Funk, MD Referring: Chilton Greathouse, MD Visit Date: 10/15/2022 Occupation: @GUAROCC @  Subjective:  No chief complaint on file.   History of Present Illness: Cheryl Quinn is a 88 y.o. female here for evaluation of highly positive RF checked associated with newly diagnosed ILD and with arthritis symptoms. She had recent fracture of the right second toe from a dropped item. Also with considerable osteoarthritis in multiple joints.***   Labs reviewed 06/2022 ANA 1:80 cytoplasmic reticular/AMA dsDNA, RNP, SSA, SSB, Scl-70 neg RF 616 CCP neg  Activities of Daily Living:  Patient reports morning stiffness for *** {minute/hour:19697}.   Patient {ACTIONS;DENIES/REPORTS:21021675::"Denies"} nocturnal pain.  Difficulty dressing/grooming: {ACTIONS;DENIES/REPORTS:21021675::"Denies"} Difficulty climbing stairs: {ACTIONS;DENIES/REPORTS:21021675::"Denies"} Difficulty getting out of chair: {ACTIONS;DENIES/REPORTS:21021675::"Denies"} Difficulty using hands for taps, buttons, cutlery, and/or writing: {ACTIONS;DENIES/REPORTS:21021675::"Denies"}  No Rheumatology ROS completed.   PMFS History:  Patient Active Problem List   Diagnosis Date Noted   Meniere's disease 07/21/2018   Presbycusis of both ears 07/21/2018   Complete rupture of left rotator cuff 09/28/2015    Past Medical History:  Diagnosis Date   Arthritis    hips,shoulders   Asthma    Complete rupture of left rotator cuff 09/28/2015   Diabetes mellitus without complication (HCC)    GERD (gastroesophageal reflux disease)    with spicy foods   Glaucoma    Hyperlipidemia    Hypertension    PONV (postoperative nausea and vomiting)     No family history on file. Past Surgical History:  Procedure Laterality Date   ABDOMINAL HYSTERECTOMY     BLEPHAROPLASTY     BREAST EXCISIONAL BIOPSY Right  2003   Benign   BUNIONECTOMY WITH HAMMERTOE RECONSTRUCTION Bilateral    CHOLECYSTECTOMY     EYE SURGERY     cataract   SHOULDER ARTHROSCOPY WITH ROTATOR CUFF REPAIR Left 09/28/2015   Procedure: LEFT SHOULDER ARTHROSCOPY DEBRIDEMENT, ROTATOR CUFF REPAIR;  Surgeon: 13/02/2015, MD;  Location: [facility];  Service: Orthopedics;  Laterality: Left;   TRIGGER FINGER RELEASE Left    WRIST FRACTURE SURGERY Right    Social History   Social History Narrative   Not on file   Immunization History  Administered Date(s) Administered   Fluad Quad(high Dose 65+) 08/18/2022   Influenza, High Dose Seasonal PF 09/08/2017, 08/07/2018   PFIZER(Purple Top)SARS-COV-2 Vaccination 12/13/2019, 01/04/2020, 07/24/2020   Zoster Recombinat (Shingrix) 08/05/2018     Objective: Vital Signs: There were no vitals taken for this visit.   Physical Exam   Musculoskeletal Exam: ***  CDAI Exam: CDAI Score: -- Patient Global: --; Provider Global: -- Swollen: --; Tender: -- Joint Exam 10/15/2022   No joint exam has been documented for this visit   There is currently no information documented on the homunculus. Go to the Rheumatology activity and complete the homunculus joint exam.  Investigation: No additional findings.  Imaging: DG Foot Complete Right  Result Date: 10/10/2022 CLINICAL DATA:  Right foot injury.Follow-up fracture of the proximal phalanx of second digit. EXAM: RIGHT FOOT COMPLETE - 3+ VIEW COMPARISON:  Right foot radiographs 09/05/2022 FINDINGS: There is diffuse decreased bone mineralization. There is early healing sclerosis and peripheral callus formation with persistent fracture line lucency at the previously seen acute nondisplaced fracture  of the proximal phalanx of the second toe. There is again fusion of second PIP joint. A screw again overlies the fifth metatarsal neck. Redemonstration of remote osteotomy of the distal aspect of the great toe metatarsal. Moderate great  toe metatarsophalangeal joint space narrowing and peripheral osteophytosis. Moderate to severe second and third tarsometatarsal joint space narrowing and subchondral sclerosis. Moderate plantar calcaneal heel spur. Moderate pes planus. IMPRESSION: 1. Healing subacute nondisplaced fracture of the proximal phalanx of the second toe. 2. Moderate great toe metatarsophalangeal joint osteoarthritis. 3. Moderate to severe second and third tarsometatarsal osteoarthritis. Electronically Signed   By: Yvonne Kendall M.D.   On: 10/10/2022 13:36    Recent Labs: Lab Results  Component Value Date   WBC 8.2 07/21/2022   HGB 12.7 07/21/2022   PLT 225.0 07/21/2022   NA 142 09/15/2011   K 3.5 09/15/2011   CL 99 09/15/2011   CO2 28 09/15/2011   GLUCOSE 171 (H) 09/15/2011   BUN 16 09/15/2011   CREATININE 0.63 09/15/2011   CALCIUM 9.7 09/15/2011   GFRAA >90 09/15/2011    Speciality Comments: No specialty comments available.  Procedures:  No procedures performed Allergies: Oxycodone, Duricef [cefadroxil], Lipitor [atorvastatin], Lisinopril, and Vicodin [hydrocodone-acetaminophen]   Assessment / Plan:     Visit Diagnoses: No diagnosis found.  Orders: No orders of the defined types were placed in this encounter.  No orders of the defined types were placed in this encounter.   Face-to-face time spent with patient was *** minutes. Greater than 50% of time was spent in counseling and coordination of care.  Follow-Up Instructions: No follow-ups on file.   Collier Salina, MD  Note - This record has been created using [employer].  Chart creation errors have been sought, but may not always  have been located. Such creation errors do not reflect on  the standard of medical care.

## 2022-10-15 ENCOUNTER — Ambulatory Visit (INDEPENDENT_AMBULATORY_CARE_PROVIDER_SITE_OTHER): Payer: Medicare PPO

## 2022-10-15 ENCOUNTER — Ambulatory Visit: Payer: Medicare PPO

## 2022-10-15 ENCOUNTER — Encounter: Payer: Self-pay | Admitting: Internal Medicine

## 2022-10-15 ENCOUNTER — Ambulatory Visit: Payer: Medicare PPO | Attending: Internal Medicine | Admitting: Internal Medicine

## 2022-10-15 VITALS — BP 122/63 | HR 73 | Resp 16 | Ht 61.0 in | Wt 168.0 lb

## 2022-10-15 DIAGNOSIS — M79642 Pain in left hand: Secondary | ICD-10-CM

## 2022-10-15 DIAGNOSIS — M79641 Pain in right hand: Secondary | ICD-10-CM

## 2022-10-15 DIAGNOSIS — R768 Other specified abnormal immunological findings in serum: Secondary | ICD-10-CM

## 2022-10-15 DIAGNOSIS — J849 Interstitial pulmonary disease, unspecified: Secondary | ICD-10-CM

## 2022-10-15 DIAGNOSIS — M159 Polyosteoarthritis, unspecified: Secondary | ICD-10-CM

## 2022-10-15 NOTE — Patient Instructions (Signed)

## 2022-10-16 LAB — HEPATITIS C ANTIBODY: Hepatitis C Ab: NONREACTIVE

## 2022-10-16 LAB — SEDIMENTATION RATE: Sed Rate: 31 mm/h — ABNORMAL HIGH (ref 0–30)

## 2022-10-16 LAB — C3 AND C4
C3 Complement: 157 mg/dL
C4 Complement: 20 mg/dL

## 2022-10-16 LAB — C-REACTIVE PROTEIN: CRP: 1.5 mg/L (ref <8.0)

## 2022-10-17 NOTE — Telephone Encounter (Signed)
Spoke with Cheryl Quinn. She confirmed that the referral to urology has been sent and a confirmation had been received on 11/01. The referral was sent to [facility] here in [place]. At the time of the referral, they were having some scheduling issues.   Will need to call [facility] on Monday since they are closed for the day.

## 2022-10-27 ENCOUNTER — Ambulatory Visit: Payer: Medicare PPO | Admitting: Pulmonary Disease

## 2022-10-28 ENCOUNTER — Encounter: Payer: Self-pay | Admitting: Pulmonary Disease

## 2022-10-28 ENCOUNTER — Ambulatory Visit: Payer: Medicare PPO | Admitting: Pulmonary Disease

## 2022-10-28 VITALS — BP 128/62 | HR 65 | Temp 98.2°F | Ht 61.0 in | Wt 166.0 lb

## 2022-10-28 DIAGNOSIS — J849 Interstitial pulmonary disease, unspecified: Secondary | ICD-10-CM | POA: Diagnosis not present

## 2022-10-28 DIAGNOSIS — N289 Disorder of kidney and ureter, unspecified: Secondary | ICD-10-CM

## 2022-10-28 MED ORDER — PREDNISONE 10 MG PO TABS: ORAL_TABLET | ORAL | 0 refills | Status: DC

## 2022-10-28 MED ORDER — AREXVY 120 MCG/0.5ML IM SUSR: 0.5000 mL | Freq: Once | INTRAMUSCULAR | 0 refills | Status: AC

## 2022-10-28 NOTE — Progress Notes (Signed)
Cheryl Quinn    MN:[id number]    03/21/1933  Primary Care Physician:Griffin, Jenny Reichmann, MD  Referring Physician: Lavone Orn, MD [facility] [employer] [address] [place],  [place] [address]  Chief complaint: Follow-up for interstitial lung disease  HPI: 88 y.o. who  has a past medical history of Arthritis, Asthma, Complete rupture of left rotator cuff (09/28/2015), Diabetes mellitus without complication ([place]), GERD (gastroesophageal reflux disease), Glaucoma, Hyperlipidemia, Hypertension, PONV (postoperative nausea and vomiting), and Rotator cuff tear.   Referred here for evaluation of interstitial lung disease.  She had cough and shortness of breath which is worsened after she developed pneumonia seen outpatient earlier this year.  She had several chest x-rays and at least 2 rounds of antibiotics with some improvement but continues to have mild persistent symptoms.  She has history of recurrent bronchitis and asthma but not on controller medication.  She is using albuterol as needed.  History also notable for seasonal allergies and occasional GERD symptoms  Pets: No pets Occupation: Retired Ship broker Exposures: Has down pillows.  No mold, hot tub, Jacuzzi. ILD questionnaire 07/21/2022-negative except for above Smoking history: Smoked 2 to 3 cigarettes a day for 45 years.  Quit in 2000 Travel history: No significant travel history Relevant family history: Sister has COPD  Interim history: Feels that breathing is improved.  She has seen Dr. Benjamine Mola from Rheumatology for elevated rheumatoid factor.  He feels there is no evidence of rheumatoid arthritis but she has advanced osteoarthritis  Outpatient Encounter Medications as of 10/28/2022  Medication Sig   ACCU-CHEK GUIDE test strip USE AS DIRECTED TO CHECK BLOOD SUGARS DAILY 90 DAYS   acetaminophen (TYLENOL) 500 MG tablet Take by mouth.   albuterol (PROVENTIL HFA;VENTOLIN HFA) 108 (90 BASE) MCG/ACT inhaler Inhale into the lungs every  6 (six) hours as needed for wheezing or shortness of breath.   Ascorbic Acid (VITAMIN C) 1000 MG tablet Take 1,000 mg by mouth daily.   brimonidine (ALPHAGAN P) 0.1 % SOLN    budesonide-formoterol (SYMBICORT) 160-4.5 MCG/ACT inhaler Inhale 2 puffs into the lungs in the morning and at bedtime.   CRESTOR 10 MG tablet Take 10 mg by mouth daily.   diclofenac (FLECTOR) 1.3 % PTCH 1 PATCH TO SKIN TO MOST PAINFUL AREA AS NEEDED TRANSDERMAL 30 DAY(S)   dorzolamide-timolol (COSOPT) 22.3-6.8 MG/ML ophthalmic solution INSTILL 1 DROP INTO BOTH EYES 2 TIMES DAILY.   famotidine (PEPCID) 10 MG tablet Take 10 mg by mouth 2 (two) times daily.   fluticasone (FLONASE) 50 MCG/ACT nasal spray Place into both nostrils daily.   furosemide (LASIX) 20 MG tablet Take 20 mg by mouth daily.   glimepiride (AMARYL) 2 MG tablet Take 2 mg by mouth every morning.   KLOR-CON M10 10 MEQ tablet Take 10 mEq by mouth daily.   LUMIGAN 0.01 % SOLN    metFORMIN (GLUCOPHAGE-XR) 500 MG 24 hr tablet SMARTSIG:2 Tablet(s) By Mouth Every Evening   sertraline (ZOLOFT) 50 MG tablet Take 50 mg by mouth daily.   VITAMIN D PO Take 1 capsule by mouth every evening.   [DISCONTINUED] GLIPIZIDE PO Take by mouth.   No facility-administered encounter medications on file as of 10/28/2022.   Physical Exam: Blood pressure 128/62, pulse 65, temperature 98.2 F (36.8 C), temperature source Oral, height 5\' 1"  (1.549 m), weight 166 lb (75.3 kg), SpO2 96 %. Gen:      No acute distress HEENT:  EOMI, sclera anicteric Neck:  No masses; no thyromegaly Lungs:    Clear to auscultation bilaterally; normal respiratory effort CV:         Regular rate and rhythm; no murmurs Abd:      + bowel sounds; soft, non-tender; no palpable masses, no distension Ext:    No edema; adequate peripheral perfusion Skin:      Warm and dry; no rash Neuro: alert and oriented x 3 Psych: normal mood and affect   Data Reviewed: Imaging: Chest x-ray 04/15/2022-diffuse  interstitial thickening. Chest x-ray 05/06/2022-bibasal opacities Chest x-ray 06/05/2022-chronic interstitial opacities with bibasal atelectasis High-resolution CT chest 09/02/2022-mild pulmonary fibrosis with basal gradient, septal thickening, traction bronchiectasis and bronchiolectasis.  Probable UIP.  Kidney lesions noted. I reviewed the images personally  PFTs: 09/15/2022 FVC 2.08 [111%], FEV1 2.04 [136%], F/F 90, TLC 4.37 [93%], DLCO 14.29 [84%] Normal test, Airtrapping and bronchodilator response suggests minimal airway obstruction  Labs: CTD serologies 07/21/2022-ANA 1: 180, cytoplasmic.  Rheumatoid factor 6.6  Assessment:  Evaluation for interstitial lung disease Reviewed CT which shows mild fibrosis and probable UIP pattern.  She does have arthritis symptoms with elevated ANA and rheumatoid factor.  Noted to have ongoing exposure to down feather  We reviewed findings today and possibilities include chronic HP or IPF.  No evidence of rheumatoid arthritis on rheumatology evaluation.  I do not believe she is a good candidate for bronchoscopy or lung biopsy given her age  I have asked her to get rid of the down pillows and will give her a trial of steroid taper.  Started 40 mg for 2 weeks then taper by 10 mg every 2 weeks after Will likely need a multidisciplinary ILD conference discussion  Kidney lesions Noted on recent CT chest.  She has been referred to urology but did not follow through.  Will make the referral again  Asthma PFTs reviewed with trapping and bronchodilator response suggesting airway disease.  She also has trapping noted on CT scan Continue Symbicort  Plan/Recommendations: Prednisone Referral to urology Continue Symbicort ILD conference discussion.  Chilton Greathouse MD [facility] 10/28/2022, 2:23 PM  CC: Kirby Funk, MD

## 2022-10-28 NOTE — Patient Instructions (Signed)
Please get rid of the of down pillows We will start you on prednisone at 40 mg a day for 2 weeks and then taper by 10 mg every 2 weeks Please monitor your blood sugar while on the prednisone Follow-up in 1 month.

## 2022-10-29 DIAGNOSIS — M170 Bilateral primary osteoarthritis of knee: Secondary | ICD-10-CM | POA: Diagnosis not present

## 2022-11-05 DIAGNOSIS — M170 Bilateral primary osteoarthritis of knee: Secondary | ICD-10-CM | POA: Diagnosis not present

## 2022-11-05 DIAGNOSIS — E1165 Type 2 diabetes mellitus with hyperglycemia: Secondary | ICD-10-CM | POA: Diagnosis not present

## 2022-11-06 ENCOUNTER — Ambulatory Visit: Payer: Medicare PPO | Admitting: Podiatrist

## 2022-11-06 ENCOUNTER — Encounter: Payer: Self-pay | Admitting: Podiatrist

## 2022-11-06 DIAGNOSIS — S92501D Displaced unspecified fracture of right lesser toe(s), subsequent encounter for fracture with routine healing: Secondary | ICD-10-CM | POA: Diagnosis not present

## 2022-11-06 NOTE — Progress Notes (Signed)
  HPI: Patient is 88 y.o. female who presents today for follow up of fracture from 09/04/2022 where she dropped a gallon + bottle of laundry detergent on her toe.  She relates the pain has improved and she is able to wear her enclosed shoes without pain.  She is also curious if she might have fungus on her toenails or if this could be discoloration from pedicures.  Allergies  Allergen Reactions   Oxycodone Shortness Of Breath, Nausea And Vomiting, Anxiety and Other (See Comments)    Sweating; hullicinations;    Duricef [Cefadroxil] Diarrhea   Lipitor [Atorvastatin] Other (See Comments)    Muscle weakenss   Lisinopril    Vicodin [Hydrocodone-Acetaminophen] Other (See Comments)    hallucinations    Review of systems is negative except as noted in the HPI.  Denies nausea/ vomiting/ fevers/ chills or night sweats.   Denies difficulty breathing, denies calf pain or tenderness  Physical Exam  Patient is awake, alert, and oriented x 3.  In no acute distress.    Vascular status is intact with palpable pedal pulses DP and PT bilateral and capillary refill time less than 3 seconds bilateral.  No edema or erythema noted.   Neurological exam reveals epicritic and protective sensation grossly intact bilateral.   Dermatological exam reveals skin is supple and dry to bilateral feet.  Left hallux nail has some discoloration white streaks within the toenail itself.  Likely fungal in nature.  Remainder of the nails have a similar appearance.  Could also be from long-term nail polish use.  Musculoskeletal exam: Musculature intact with dorsiflexion, plantarflexion, inversion, eversion. Ankle and First MPJ joint range of motion normal.   Right second toe pain is significantly improved.  Minimal tenderness to palpation at central aspect of the proximal phalanx is noted.  Again much improved.  Assessment:   ICD-10-CM   1. Closed fracture of phalanx of right second toe with routine healing, subsequent  encounter  S92.501D          Plan: Exam findings and treatment recommendations are discussed.  Recommended use of her good sketcher shoes as long as they are not bothersome on the toe.  The second toe appears to be healing nicely.  Also discussed in the future should she want routine nail trims she can call and we are happy to do this for her.  Otherwise she will be seen back as needed in the future.

## 2022-11-12 DIAGNOSIS — M170 Bilateral primary osteoarthritis of knee: Secondary | ICD-10-CM | POA: Diagnosis not present

## 2022-12-02 ENCOUNTER — Ambulatory Visit (INDEPENDENT_AMBULATORY_CARE_PROVIDER_SITE_OTHER): Payer: Medicare PPO | Admitting: Pulmonary Disease

## 2022-12-02 DIAGNOSIS — J849 Interstitial pulmonary disease, unspecified: Secondary | ICD-10-CM

## 2022-12-05 ENCOUNTER — Ambulatory Visit: Payer: Medicare PPO | Admitting: Pulmonary Disease

## 2022-12-05 ENCOUNTER — Encounter: Payer: Self-pay | Admitting: Pulmonary Disease

## 2022-12-05 VITALS — BP 138/64 | HR 81 | Temp 98.1°F | Ht 61.0 in | Wt 165.0 lb

## 2022-12-05 DIAGNOSIS — J849 Interstitial pulmonary disease, unspecified: Secondary | ICD-10-CM | POA: Diagnosis not present

## 2022-12-05 DIAGNOSIS — R0602 Shortness of breath: Secondary | ICD-10-CM

## 2022-12-05 DIAGNOSIS — Z5181 Encounter for therapeutic drug level monitoring: Secondary | ICD-10-CM | POA: Diagnosis not present

## 2022-12-05 NOTE — Patient Instructions (Signed)
Will check comprehensive metabolic panel, CBC and proBNP for dyspnea today Start you on a medication called Esbriet for treatment of pulmonary fibrosis Follow-up in 2 to 3 months.

## 2022-12-05 NOTE — Progress Notes (Signed)
Cheryl Quinn    [id number]    1932-12-29  Primary Care Physician:Griffin, Jenny Reichmann, MD  Referring Physician: Lavone Orn, MD [facility] [employer] [address],  [place] [address]  Chief complaint: Follow-up for interstitial lung disease  HPI: 89 y.o. who  has a past medical history of Arthritis, Asthma, Complete rupture of left rotator cuff (09/28/2015), Diabetes mellitus without complication ([place]), GERD (gastroesophageal reflux disease), Glaucoma, Hyperlipidemia, Hypertension, PONV (postoperative nausea and vomiting), and Rotator cuff tear.   Referred here for evaluation of interstitial lung disease.  She had cough and shortness of breath which is worsened after she developed pneumonia seen outpatient earlier this year.  She had several chest x-rays and at least 2 rounds of antibiotics with some improvement but continues to have mild persistent symptoms.  She has history of recurrent bronchitis and asthma but not on controller medication.  She is using albuterol as needed.  History also notable for seasonal allergies and occasional GERD symptoms  She has seen Dr. Benjamine Mola from Rheumatology in November 2023 for elevated rheumatoid factor.  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  No mold, hot tub, Jacuzzi. ILD questionnaire 07/21/2022-negative except for above Smoking history: Smoked 2 to 3 cigarettes a day for 45 years.  Quit in 2000 Travel history: No significant travel history Relevant family history: Sister has COPD  Interim history: She was given a trial of prednisone in December 2023 for presumptive diagnosis of HP as she had a exposure to down She feels that the prednisone actually make her breathing worse and sugars are elevated  Reviewed her case at multidisciplinary conference in January 2024 and findings were felt to be more consistent with  IPF  Outpatient Encounter Medications as of 12/05/2022  Medication Sig   ACCU-CHEK GUIDE test strip USE AS DIRECTED TO CHECK BLOOD SUGARS DAILY 90 DAYS   acetaminophen (TYLENOL) 500 MG tablet Take by mouth.   albuterol (PROVENTIL HFA;VENTOLIN HFA) 108 (90 BASE) MCG/ACT inhaler Inhale into the lungs every 6 (six) hours as needed for wheezing or shortness of breath.   Ascorbic Acid (VITAMIN C) 1000 MG tablet Take 1,000 mg by mouth daily.   brimonidine (ALPHAGAN P) 0.1 % SOLN    budesonide-formoterol (SYMBICORT) 160-4.5 MCG/ACT inhaler Inhale 2 puffs into the lungs in the morning and at bedtime.   CRESTOR 10 MG tablet Take 10 mg by mouth daily.   diclofenac (FLECTOR) 1.3 % PTCH 1 PATCH TO SKIN TO MOST PAINFUL AREA AS NEEDED TRANSDERMAL 30 DAY(S)   dorzolamide-timolol (COSOPT) 22.3-6.8 MG/ML ophthalmic solution INSTILL 1 DROP INTO BOTH EYES 2 TIMES DAILY.   famotidine (PEPCID) 10 MG tablet Take 10 mg by mouth 2 (two) times daily.   fluticasone (FLONASE) 50 MCG/ACT nasal spray Place into both nostrils daily.   furosemide (LASIX) 20 MG tablet Take 20 mg by mouth daily.   glimepiride (AMARYL) 2 MG tablet Take 2 mg by mouth every morning.   KLOR-CON M10 10 MEQ tablet Take 10 mEq by mouth daily.   LUMIGAN 0.01 % SOLN    metFORMIN (GLUCOPHAGE-XR) 500 MG 24 hr tablet SMARTSIG:2 Tablet(s) By Mouth Every Evening   predniSONE (DELTASONE) 10 MG tablet Take 40mg  daily x's 2 weeks, 30mg  x's 2 weeks, 20mg  x's 2 weeks, 10mg  x's 2 weeks   sertraline (ZOLOFT) 50 MG tablet Take 50 mg by mouth  daily.   VITAMIN D PO Take 1 capsule by mouth every evening.   No facility-administered encounter medications on file as of 12/05/2022.   Physical Exam: Blood pressure 138/64, pulse 81, temperature 98.1 F (36.7 C), temperature source Oral, height 5\' 1"  (1.549 m), weight 165 lb (74.8 kg), SpO2 95 %. Gen:      No acute distress HEENT:  EOMI, sclera anicteric Neck:     No masses; no thyromegaly Lungs:    Clear to  auscultation bilaterally; normal respiratory effort CV:         Regular rate and rhythm; no murmurs Abd:      + bowel sounds; soft, non-tender; no palpable masses, no distension Ext:    No edema; adequate peripheral perfusion Skin:      Warm and dry; no rash Neuro: alert and oriented x 3 Psych: normal mood and affect   Data Reviewed: Imaging: Chest x-ray 04/15/2022-diffuse interstitial thickening. Chest x-ray 05/06/2022-bibasal opacities Chest x-ray 06/05/2022-chronic interstitial opacities with bibasal atelectasis High-resolution CT chest 09/02/2022-mild pulmonary fibrosis with basal gradient, septal thickening, traction bronchiectasis and bronchiolectasis.  Probable UIP.  Kidney lesions noted. I reviewed the images personally  PFTs: 09/15/2022 FVC 2.08 [111%], FEV1 2.04 [136%], F/F 90, TLC 4.37 [93%], DLCO 14.29 [84%] Normal test, Airtrapping and bronchodilator response suggests minimal airway obstruction  Labs: CTD serologies 07/21/2022-ANA 1: 180, cytoplasmic.  Rheumatoid factor 6.6  Assessment:  Evaluation for interstitial lung disease Reviewed CT which shows mild fibrosis and probable UIP pattern.  She does have arthritis symptoms with elevated ANA and rheumatoid factor.  Noted to have ongoing exposure to down feather  We reviewed findings today and possibilities include chronic HP or IPF.  No evidence of rheumatoid arthritis on rheumatology evaluation.  I do not believe she is a good candidate for bronchoscopy or lung biopsy given her age  She has gotten rid of down pillows in December 2023.  Has not responded well to steroids which made her symptoms worse. After discussion at ILD conference it was felt that the presentation is more consistent with IPF.  I will taper off steroids completely within the next 2 weeks We discussed other therapeutic options including antifibrotics.  After informed decision making we will try Esbriet.  Avoid Ofev as she already has diarrhea.  There is  low threshold to stop if she develops any symptoms  Kidney lesions Noted on recent CT chest.  She has been referred to urology but has not heard from them.  She and her husband feel that they would not want surgery or chemotherapy even if malignancy was diagnosed and want to cancel the referral  Asthma PFTs reviewed with trapping and bronchodilator response suggesting airway disease.  She also has trapping noted on CT scan Continue Symbicort  Plan/Recommendations: Taper off prednisone Start Esbriet, check CMP, proBNP for monitoring Continue Symbicort  Marshell Garfinkel MD [facility] 12/05/2022, 2:54 PM  CC: Lavone Orn, MD

## 2022-12-06 LAB — COMPREHENSIVE METABOLIC PANEL
ALT: 15 IU/L (ref 0–32)
AST: 14 IU/L (ref 0–40)
Albumin/Globulin Ratio: 2 (ref 1.2–2.2)
Albumin: 4.3 g/dL (ref 3.7–4.7)
Alkaline Phosphatase: 52 IU/L (ref 44–121)
BUN/Creatinine Ratio: 21 (ref 12–28)
BUN: 26 mg/dL (ref 8–27)
Bilirubin Total: 0.4 mg/dL (ref 0.0–1.2)
CO2: 23 mmol/L (ref 20–29)
Calcium: 9.3 mg/dL (ref 8.7–10.3)
Chloride: 101 mmol/L (ref 96–106)
Creatinine, Ser: 1.23 mg/dL — ABNORMAL HIGH (ref 0.57–1.00)
Globulin, Total: 2.2 g/dL (ref 1.5–4.5)
Glucose: 242 mg/dL — ABNORMAL HIGH (ref 70–99)
Potassium: 4.9 mmol/L (ref 3.5–5.2)
Sodium: 142 mmol/L (ref 134–144)
Total Protein: 6.5 g/dL (ref 6.0–8.5)
eGFR: 43 mL/min/{1.73_m2} — ABNORMAL LOW (ref >59)

## 2022-12-06 LAB — CBC WITH DIFFERENTIAL/PLATELET
Basophils Absolute: 0 10*3/uL (ref 0.0–0.2)
Basos: 0 %
EOS (ABSOLUTE): 0 10*3/uL (ref 0.0–0.4)
Eos: 0 %
Hematocrit: 41 % (ref 34.0–46.6)
Hemoglobin: 13.9 g/dL (ref 11.1–15.9)
Immature Grans (Abs): 0.1 10*3/uL (ref 0.0–0.1)
Immature Granulocytes: 1 %
Lymphocytes Absolute: 1.8 10*3/uL (ref 0.7–3.1)
Lymphs: 14 %
MCH: 31.7 pg (ref 26.6–33.0)
MCHC: 33.9 g/dL (ref 31.5–35.7)
MCV: 93 fL (ref 79–97)
Monocytes Absolute: 0.2 10*3/uL (ref 0.1–0.9)
Monocytes: 1 %
Neutrophils Absolute: 10.9 10*3/uL — ABNORMAL HIGH (ref 1.4–7.0)
Neutrophils: 84 %
Platelets: 207 10*3/uL (ref 150–450)
RBC: 4.39 x10E6/uL (ref 3.77–5.28)
RDW: 13.7 % (ref 11.7–15.4)
WBC: 13 10*3/uL — ABNORMAL HIGH (ref 3.4–10.8)

## 2022-12-06 LAB — PRO B NATRIURETIC PEPTIDE: NT-Pro BNP: 291 pg/mL (ref 0–738)

## 2022-12-09 ENCOUNTER — Other Ambulatory Visit (HOSPITAL_COMMUNITY): Payer: Self-pay

## 2022-12-09 ENCOUNTER — Other Ambulatory Visit: Payer: Self-pay | Admitting: *Deleted

## 2022-12-09 ENCOUNTER — Telehealth: Payer: Self-pay | Admitting: Pharmacist

## 2022-12-09 DIAGNOSIS — Z5181 Encounter for therapeutic drug level monitoring: Secondary | ICD-10-CM

## 2022-12-09 NOTE — Telephone Encounter (Signed)
Received new start paperwork for Esbriet. Submitted a Prior Authorization request to [facility] for ESBRIET via CoverMyMeds. Will update once we receive a response.  Key: [id number]  Per automated response: Authorization already on file for this request. Per test claim, copay for both brand name Esbriet and generic pirfenidone is $100  Will need to call insurance to retrieve PA approval letter to submit with PAP  [place] forms in PAP pending info folder in pharmacy office  Knox Saliva, PharmD, MPH, BCPS, CPP Clinical Pharmacist (Rheumatology and Pulmonology)

## 2022-12-10 ENCOUNTER — Other Ambulatory Visit: Payer: Self-pay | Admitting: *Deleted

## 2022-12-10 MED ORDER — BUDESONIDE-FORMOTEROL FUMARATE 160-4.5 MCG/ACT IN AERO: 2.0000 | INHALATION_SPRAY | Freq: Two times a day (BID) | RESPIRATORY_TRACT | 5 refills | Status: DC

## 2022-12-10 NOTE — Telephone Encounter (Signed)
[place] to request PA approval letter for Esbriet/pirfenidone be faxed to clinic. Per rep, patient's authorization has been auto-extended to 11/24/2023. They will fax within 72 hours to pulm Onbase.  [place] # [id number]  Phone: [phone]  Knox Saliva, PharmD, MPH, BCPS, CPP Clinical Pharmacist (Rheumatology and Pulmonology)

## 2022-12-11 ENCOUNTER — Telehealth: Payer: Self-pay | Admitting: Pharmacist

## 2022-12-11 NOTE — Telephone Encounter (Signed)
Read him the last tel notes by [facility].

## 2022-12-11 NOTE — Telephone Encounter (Signed)
Received additional clinical questions from [facility].  Faxed to [facility] with Chart notes.  Phone# [phone] Fax# [phone]

## 2022-12-11 NOTE — Telephone Encounter (Signed)
This is still pending right now.  Knox Saliva, PharmD, MPH, BCPS, CPP Clinical Pharmacist (Rheumatology and Pulmonology)

## 2022-12-11 NOTE — Telephone Encounter (Signed)
PT completed Fin assistance for Espriet and has not heard back. Will notify Pharm. Husband is in lobby now. Can you assist?

## 2022-12-12 ENCOUNTER — Other Ambulatory Visit (HOSPITAL_COMMUNITY): Payer: Self-pay

## 2022-12-12 NOTE — Telephone Encounter (Signed)
Received notification from [facility] regarding a prior authorization for ESBRIET. Authorization has been APPROVED from 12/11/2022 to 11/24/2023. Approval letter sent to scan center.  Per test claim, copay for 30 days supply is $100 (for brand name Esbriet) and $100 (for generic pirfenidone).   Patient can fill through [place]: [phone]   Authorization # [id number]  Phone # [phone]  Patient would like to try for [facility] patient assistance. Updated forms require patient to write annual household income. She states she will discus with her husband and call me back directly with info. App placed in PAP pending info.  Knox Saliva, PharmD, MPH, BCPS, CPP Clinical Pharmacist (Rheumatology and Pulmonology)

## 2022-12-12 NOTE — Telephone Encounter (Signed)
Patient's husband called back with household information information. Submitted Patient Assistance Application to [place] for [place] along with provider portion, patient portion, med list, insurance card copy, and PA. Will update patient when we receive a response.  Fax# [phone] Phone# [phone]  Knox Saliva, PharmD, MPH, BCPS, CPP Clinical Pharmacist (Rheumatology and Pulmonology)

## 2022-12-19 ENCOUNTER — Encounter: Payer: Self-pay | Admitting: Pharmacist

## 2022-12-19 ENCOUNTER — Other Ambulatory Visit (HOSPITAL_COMMUNITY): Payer: Self-pay

## 2022-12-19 ENCOUNTER — Telehealth: Payer: Self-pay | Admitting: Pharmacist

## 2022-12-19 DIAGNOSIS — Z5181 Encounter for therapeutic drug level monitoring: Secondary | ICD-10-CM

## 2022-12-19 DIAGNOSIS — J849 Interstitial pulmonary disease, unspecified: Secondary | ICD-10-CM

## 2022-12-19 DIAGNOSIS — Z7189 Other specified counseling: Secondary | ICD-10-CM

## 2022-12-19 MED ORDER — PIRFENIDONE 267 MG PO TABS
ORAL_TABLET | ORAL | 0 refills | Status: DC
  Filled 2022-12-19: qty 207, fill #0
  Filled 2022-12-22: qty 207, 30d supply, fill #0

## 2022-12-19 MED ORDER — PIRFENIDONE 267 MG PO TABS
801.0000 mg | ORAL_TABLET | Freq: Three times a day (TID) | ORAL | 5 refills | Status: DC
  Filled 2022-12-19: qty 270, 30d supply, fill #0

## 2022-12-19 NOTE — Telephone Encounter (Signed)
Received fax from [facility] for [place] patient assistance, patient's application has been DENIED because patient does not meet health insurance criteria to receive Esbriet free of charge. Called [facility]. Per rep, patient's out-of-pocket for drugs must be greater than 7.5% of annual gross income. Patient's out of pocket max is $3400 and that is <7.5% of her total household income.    Phone# [phone]  Patient will have to fill with pharmacy with $100 copay. She is amenable to filling with [facility].  Knox Saliva, PharmD, MPH, BCPS, CPP Clinical Pharmacist (Rheumatology and Pulmonology)

## 2022-12-19 NOTE — Telephone Encounter (Signed)
Called patient to provide pirfenidone counseling. She requested to talk when her husband can take notes. Provided her with my direct number.  Knox Saliva, PharmD, MPH, BCPS, CPP Clinical Pharmacist (Rheumatology and Pulmonology)

## 2022-12-19 NOTE — Telephone Encounter (Signed)
Subjective:  Patient called today by [facility] Pulmonary pharmacy team for pirfenidone new start.   Patient was last seen by Dr. Vaughan Browner on 12/05/2022.  Pertinent past medical history includes: HTN, T2DM, vertigo, hyperlipidemia.  Her case has been discussed at ILD MLD conference - deemed to be consistent with IPF  History of elevated LFTs: No History of diarrhea, nausea, vomiting: No  Objective: Allergies  Allergen Reactions   Oxycodone Shortness Of Breath, Nausea And Vomiting, Anxiety and Other (See Comments)    Sweating; hullicinations;    Duricef [Cefadroxil] Diarrhea   Lipitor [Atorvastatin] Other (See Comments)    Muscle weakenss   Lisinopril    Vicodin [Hydrocodone-Acetaminophen] Other (See Comments)    hallucinations    Outpatient Encounter Medications as of 12/19/2022  Medication Sig Note   ACCU-CHEK GUIDE test strip USE AS DIRECTED TO CHECK BLOOD SUGARS DAILY 90 DAYS    acetaminophen (TYLENOL) 500 MG tablet Take by mouth.    albuterol (PROVENTIL HFA;VENTOLIN HFA) 108 (90 BASE) MCG/ACT inhaler Inhale into the lungs every 6 (six) hours as needed for wheezing or shortness of breath.    Ascorbic Acid (VITAMIN C) 1000 MG tablet Take 1,000 mg by mouth daily.    brimonidine (ALPHAGAN P) 0.1 % SOLN     budesonide-formoterol (SYMBICORT) 160-4.5 MCG/ACT inhaler Inhale 2 puffs into the lungs in the morning and at bedtime.    CRESTOR 10 MG tablet Take 10 mg by mouth daily. 05/08/2015: Received from: External Pharmacy   diclofenac (FLECTOR) 1.3 % PTCH 1 PATCH TO SKIN TO MOST PAINFUL AREA AS NEEDED TRANSDERMAL 30 DAY(S)    dorzolamide-timolol (COSOPT) 22.3-6.8 MG/ML ophthalmic solution INSTILL 1 DROP INTO BOTH EYES 2 TIMES DAILY. 05/08/2015: Received from: External Pharmacy   famotidine (PEPCID) 10 MG tablet Take 10 mg by mouth 2 (two) times daily.    fluticasone (FLONASE) 50 MCG/ACT nasal spray Place into both nostrils daily.    furosemide (LASIX) 20 MG tablet Take 20 mg by mouth daily.     glimepiride (AMARYL) 2 MG tablet Take 2 mg by mouth every morning.    KLOR-CON M10 10 MEQ tablet Take 10 mEq by mouth daily.    LUMIGAN 0.01 % SOLN  05/08/2015: Received from: External Pharmacy   metFORMIN (GLUCOPHAGE-XR) 500 MG 24 hr tablet SMARTSIG:2 Tablet(s) By Mouth Every Evening    predniSONE (DELTASONE) 10 MG tablet Take 40mg  daily x's 2 weeks, 30mg  x's 2 weeks, 20mg  x's 2 weeks, 10mg  x's 2 weeks    sertraline (ZOLOFT) 50 MG tablet Take 50 mg by mouth daily.    VITAMIN D PO Take 1 capsule by mouth every evening.    No facility-administered encounter medications on file as of 12/19/2022.     Immunization History  Administered Date(s) Administered   Fluad Quad(high Dose 65+) 08/18/2022   Influenza, High Dose Seasonal PF 09/08/2017, 08/07/2018   PFIZER(Purple Top)SARS-COV-2 Vaccination 12/13/2019, 01/04/2020, 07/24/2020   Zoster Recombinat (Shingrix) 08/05/2018, 11/02/2018      PFT's TLC  Date Value Ref Range Status  07/21/2022 4.37 L Final    CMP     Component Value Date/Time   NA 142 12/05/2022 1538   K 4.9 12/05/2022 1538   CL 101 12/05/2022 1538   CO2 23 12/05/2022 1538   GLUCOSE 242 (H) 12/05/2022 1538   GLUCOSE 171 (H) 09/15/2011 1315   BUN 26 12/05/2022 1538   CREATININE 1.23 (H) 12/05/2022 1538   CALCIUM 9.3 12/05/2022 1538   PROT 6.5 12/05/2022 1538  ALBUMIN 4.3 12/05/2022 1538   AST 14 12/05/2022 1538   ALT 15 12/05/2022 1538   ALKPHOS 52 12/05/2022 1538   BILITOT 0.4 12/05/2022 1538   GFRNONAA 86 (L) 09/15/2011 1315   GFRAA >90 09/15/2011 1315    CBC    Component Value Date/Time   WBC 13.0 (H) 12/05/2022 1538   WBC 8.2 07/21/2022 1007   RBC 4.39 12/05/2022 1538   RBC 4.12 07/21/2022 1007   HGB 13.9 12/05/2022 1538   HCT 41.0 12/05/2022 1538   PLT 207 12/05/2022 1538   MCV 93 12/05/2022 1538   MCH 31.7 12/05/2022 1538   MCHC 33.9 12/05/2022 1538   MCHC 33.3 07/21/2022 1007   RDW 13.7 12/05/2022 1538   LYMPHSABS 1.8 12/05/2022 1538    MONOABS 0.3 07/21/2022 1007   EOSABS 0.0 12/05/2022 1538   BASOSABS 0.0 12/05/2022 1538    LFT's    Latest Ref Rng & Units 12/05/2022    3:38 PM  Hepatic Function  Total Protein 6.0 - 8.5 g/dL 6.5   Albumin 3.7 - 4.7 g/dL 4.3   AST 0 - 40 IU/L 14   ALT 0 - 32 IU/L 15   Alk Phosphatase 44 - 121 IU/L 52   Total Bilirubin 0.0 - 1.2 mg/dL 0.4     HRCT (08/26/2022) - Mild pulmonary fibrosis in a pattern with slight apical to basal gradient, featuring irregular peripheral interstitial opacity, septal thickening, ground-glass, traction bronchiectasis, and small areas of subpleural bronchiolectasis at the lung bases. Findings are categorized as probable UIP per consensus guidelines  Assessment and Plan  Pirfenidone Medication Management Thoroughly counseled patient on the efficacy, mechanism of action, dosing, administration, adverse effects, and monitoring parameters of Esbriet.  Patient verbalized understanding.   Goals of Therapy: Will not stop or reverse the progression of ILD. It will slow the progression of ILD.   Dosing: Starting dose will be Esbriet 267 mg 1 tablet three times daily for 7 days, then 2 tablets three times daily for 7 days, then 3 tablets three times daily.  Maintenance dose will be 801 mg 1 tablet three times daily if tolerated.  Stressed the importance of taking with meals and space at least 5-6 hours apart to minimize stomach upset.   Adverse Effects: Nausea, vomiting, diarrhea, weight loss Abdominal pain GERD Sun sensitivity/rash - patient advised to wear sunscreen when exposed to sunlight Dizziness Fatigue  Monitoring: Monitor for diarrhea, nausea and vomiting, GI perforation, hepatotoxicity  Monitor LFTs - baseline, monthly for first 6 months, then every 3 months routinely CBC w differential at baseline and every 3 months routinely  Access: Approval of Esbriet through: insurance Rx sent to: [facility]: [phone]   Provided them with phone number  Medication Reconciliation A drug regimen assessment was performed, including review of allergies, interactions, disease-state management, dosing and immunization history. Medications were reviewed with the patient, including name, instructions, indication, goals of therapy, potential side effects, importance of adherence, and safe use.  Thank you for involving pharmacy to assist in providing this patient's care.   Knox Saliva, PharmD, MPH, BCPS, CPP Clinical Pharmacist (Rheumatology and Pulmonology)

## 2022-12-22 ENCOUNTER — Other Ambulatory Visit (HOSPITAL_COMMUNITY): Payer: Self-pay

## 2022-12-22 ENCOUNTER — Other Ambulatory Visit: Payer: Self-pay

## 2022-12-22 NOTE — Telephone Encounter (Signed)
Delivery instructions have been updated in [facility], medication will be shipped to patient's home address by 12/24/22.  Rx has been processed in [facility] and there is a copay of $100.00. Payment information has been collected and forwarded to the pharmacy.

## 2022-12-23 ENCOUNTER — Other Ambulatory Visit (INDEPENDENT_AMBULATORY_CARE_PROVIDER_SITE_OTHER): Payer: Medicare PPO

## 2022-12-23 DIAGNOSIS — Z5181 Encounter for therapeutic drug level monitoring: Secondary | ICD-10-CM | POA: Diagnosis not present

## 2022-12-23 DIAGNOSIS — J849 Interstitial pulmonary disease, unspecified: Secondary | ICD-10-CM

## 2022-12-23 LAB — HEPATIC FUNCTION PANEL
ALT: 19 U/L (ref 0–35)
AST: 17 U/L (ref 0–37)
Albumin: 3.8 g/dL (ref 3.5–5.2)
Alkaline Phosphatase: 50 U/L (ref 39–117)
Bilirubin, Direct: 0.1 mg/dL (ref 0.0–0.3)
Total Bilirubin: 0.4 mg/dL (ref 0.2–1.2)
Total Protein: 7.4 g/dL (ref 6.0–8.3)

## 2022-12-24 ENCOUNTER — Emergency Department (HOSPITAL_COMMUNITY): Payer: Medicare PPO

## 2022-12-24 ENCOUNTER — Other Ambulatory Visit: Payer: Self-pay

## 2022-12-24 ENCOUNTER — Encounter (HOSPITAL_COMMUNITY): Payer: Self-pay

## 2022-12-24 ENCOUNTER — Observation Stay (HOSPITAL_COMMUNITY)
Admission: EM | Admit: 2022-12-24 | Discharge: 2022-12-26 | Disposition: A | Discharge status: Home / Self Care | Payer: Medicare PPO | Attending: Family Medicine | Admitting: Family Medicine

## 2022-12-24 DIAGNOSIS — E785 Hyperlipidemia, unspecified: Secondary | ICD-10-CM | POA: Diagnosis present

## 2022-12-24 DIAGNOSIS — Z7984 Long term (current) use of oral hypoglycemic drugs: Secondary | ICD-10-CM | POA: Diagnosis not present

## 2022-12-24 DIAGNOSIS — I447 Left bundle-branch block, unspecified: Secondary | ICD-10-CM | POA: Diagnosis not present

## 2022-12-24 DIAGNOSIS — J45909 Unspecified asthma, uncomplicated: Secondary | ICD-10-CM | POA: Insufficient documentation

## 2022-12-24 DIAGNOSIS — E876 Hypokalemia: Secondary | ICD-10-CM | POA: Diagnosis not present

## 2022-12-24 DIAGNOSIS — I499 Cardiac arrhythmia, unspecified: Principal | ICD-10-CM | POA: Insufficient documentation

## 2022-12-24 DIAGNOSIS — Z87891 Personal history of nicotine dependence: Secondary | ICD-10-CM | POA: Insufficient documentation

## 2022-12-24 DIAGNOSIS — J849 Interstitial pulmonary disease, unspecified: Secondary | ICD-10-CM | POA: Diagnosis present

## 2022-12-24 DIAGNOSIS — R0602 Shortness of breath: Secondary | ICD-10-CM | POA: Diagnosis present

## 2022-12-24 DIAGNOSIS — Z79899 Other long term (current) drug therapy: Secondary | ICD-10-CM | POA: Diagnosis not present

## 2022-12-24 DIAGNOSIS — J9601 Acute respiratory failure with hypoxia: Secondary | ICD-10-CM | POA: Insufficient documentation

## 2022-12-24 DIAGNOSIS — I4719 Other supraventricular tachycardia: Secondary | ICD-10-CM | POA: Diagnosis not present

## 2022-12-24 DIAGNOSIS — J84112 Idiopathic pulmonary fibrosis: Secondary | ICD-10-CM

## 2022-12-24 DIAGNOSIS — I4891 Unspecified atrial fibrillation: Secondary | ICD-10-CM | POA: Diagnosis not present

## 2022-12-24 DIAGNOSIS — I5020 Unspecified systolic (congestive) heart failure: Secondary | ICD-10-CM | POA: Insufficient documentation

## 2022-12-24 DIAGNOSIS — I498 Other specified cardiac arrhythmias: Secondary | ICD-10-CM | POA: Diagnosis present

## 2022-12-24 DIAGNOSIS — E119 Type 2 diabetes mellitus without complications: Secondary | ICD-10-CM | POA: Insufficient documentation

## 2022-12-24 DIAGNOSIS — I10 Essential (primary) hypertension: Secondary | ICD-10-CM | POA: Diagnosis present

## 2022-12-24 LAB — CBC
HCT: 37.3 % (ref 36.0–46.0)
HCT: 34.7 % — ABNORMAL LOW (ref 36.0–46.0)
Hemoglobin: 12.5 g/dL (ref 12.0–15.0)
Hemoglobin: 12 g/dL (ref 12.0–15.0)
MCH: 31.3 pg (ref 26.0–34.0)
MCH: 32 pg (ref 26.0–34.0)
MCHC: 33.5 g/dL (ref 30.0–36.0)
MCHC: 34.6 g/dL (ref 30.0–36.0)
MCV: 93.3 fL (ref 80.0–100.0)
MCV: 92.5 fL (ref 80.0–100.0)
Platelets: 377 10*3/uL (ref 150–400)
Platelets: 360 10*3/uL (ref 150–400)
RBC: 4 MIL/uL (ref 3.87–5.11)
RBC: 3.75 MIL/uL — ABNORMAL LOW (ref 3.87–5.11)
RDW: 13.9 % (ref 11.5–15.5)
RDW: 14.3 % (ref 11.5–15.5)
WBC: 9.1 10*3/uL (ref 4.0–10.5)
WBC: 8.8 10*3/uL (ref 4.0–10.5)
nRBC: 0 % (ref 0.0–0.2)
nRBC: 0 % (ref 0.0–0.2)

## 2022-12-24 LAB — CREATININE, SERUM
Creatinine, Ser: 1.03 mg/dL — ABNORMAL HIGH (ref 0.44–1.00)
GFR, Estimated: 53 mL/min — ABNORMAL LOW (ref >60)

## 2022-12-24 LAB — BASIC METABOLIC PANEL
Anion gap: 10 (ref 5–15)
BUN: 20 mg/dL (ref 8–23)
CO2: 23 mmol/L (ref 22–32)
Calcium: 9.2 mg/dL (ref 8.9–10.3)
Chloride: 104 mmol/L (ref 98–111)
Creatinine, Ser: 1.03 mg/dL — ABNORMAL HIGH (ref 0.44–1.00)
GFR, Estimated: 53 mL/min — ABNORMAL LOW (ref >60)
Glucose, Bld: 164 mg/dL — ABNORMAL HIGH (ref 70–99)
Potassium: 3.2 mmol/L — ABNORMAL LOW (ref 3.5–5.1)
Sodium: 137 mmol/L (ref 135–145)

## 2022-12-24 LAB — TROPONIN I (HIGH SENSITIVITY)
Troponin I (High Sensitivity): 20 ng/L — ABNORMAL HIGH (ref <18)
Troponin I (High Sensitivity): 18 ng/L — ABNORMAL HIGH (ref <18)

## 2022-12-24 LAB — MAGNESIUM: Magnesium: 1.8 mg/dL (ref 1.7–2.4)

## 2022-12-24 LAB — HEMOGLOBIN A1C
Hgb A1c MFr Bld: 7.5 % — ABNORMAL HIGH (ref 4.8–5.6)
Mean Plasma Glucose: 168.55 mg/dL

## 2022-12-24 LAB — CBG MONITORING, ED: Glucose-Capillary: 139 mg/dL — ABNORMAL HIGH (ref 70–99)

## 2022-12-24 LAB — TSH: TSH: 1.611 u[IU]/mL (ref 0.350–4.500)

## 2022-12-24 LAB — BRAIN NATRIURETIC PEPTIDE: B Natriuretic Peptide: 47.4 pg/mL (ref 0.0–100.0)

## 2022-12-24 MED ORDER — ENOXAPARIN SODIUM 40 MG/0.4ML IJ SOSY
40.0000 mg | PREFILLED_SYRINGE | INTRAMUSCULAR | Status: DC
  Administered 2022-12-24 – 2022-12-25 (×2): 40 mg via SUBCUTANEOUS
  Filled 2022-12-24 (×2): qty 0.4

## 2022-12-24 MED ORDER — INSULIN ASPART 100 UNIT/ML IJ SOLN
0.0000 [IU] | Freq: Three times a day (TID) | INTRAMUSCULAR | Status: DC
  Administered 2022-12-25: 1 [IU] via SUBCUTANEOUS
  Administered 2022-12-25 – 2022-12-26 (×2): 2 [IU] via SUBCUTANEOUS

## 2022-12-24 MED ORDER — ONDANSETRON HCL 4 MG PO TABS: 4.0000 mg | ORAL_TABLET | Freq: Four times a day (QID) | ORAL | Status: DC | PRN

## 2022-12-24 MED ORDER — POTASSIUM CHLORIDE 20 MEQ PO PACK
40.0000 meq | PACK | Freq: Once | ORAL | Status: AC
  Administered 2022-12-24: 40 meq via ORAL
  Filled 2022-12-24: qty 2

## 2022-12-24 MED ORDER — ACETAMINOPHEN 325 MG PO TABS: 650.0000 mg | ORAL_TABLET | Freq: Four times a day (QID) | ORAL | Status: DC | PRN

## 2022-12-24 MED ORDER — SERTRALINE HCL 50 MG PO TABS
50.0000 mg | ORAL_TABLET | Freq: Every day | ORAL | Status: DC
  Administered 2022-12-25 – 2022-12-26 (×2): 50 mg via ORAL
  Filled 2022-12-24 (×2): qty 1

## 2022-12-24 MED ORDER — POLYETHYLENE GLYCOL 3350 17 G PO PACK: 17.0000 g | PACK | Freq: Every day | ORAL | Status: DC | PRN

## 2022-12-24 MED ORDER — FLUTICASONE FUROATE-VILANTEROL 200-25 MCG/ACT IN AEPB
1.0000 | INHALATION_SPRAY | Freq: Every day | RESPIRATORY_TRACT | Status: DC
  Administered 2022-12-24 – 2022-12-26 (×3): 1 via RESPIRATORY_TRACT
  Filled 2022-12-24: qty 28

## 2022-12-24 MED ORDER — FAMOTIDINE 20 MG PO TABS
10.0000 mg | ORAL_TABLET | Freq: Two times a day (BID) | ORAL | Status: DC
  Filled 2022-12-24 (×4): qty 1

## 2022-12-24 MED ORDER — ROSUVASTATIN CALCIUM 5 MG PO TABS
10.0000 mg | ORAL_TABLET | Freq: Every day | ORAL | Status: DC
  Administered 2022-12-25 – 2022-12-26 (×2): 10 mg via ORAL
  Filled 2022-12-24 (×2): qty 2

## 2022-12-24 MED ORDER — DILTIAZEM HCL 30 MG PO TABS
30.0000 mg | ORAL_TABLET | Freq: Four times a day (QID) | ORAL | Status: DC
  Administered 2022-12-24 – 2022-12-25 (×3): 30 mg via ORAL
  Filled 2022-12-24 (×5): qty 1

## 2022-12-24 MED ORDER — ACETAMINOPHEN 650 MG RE SUPP: 650.0000 mg | Freq: Four times a day (QID) | RECTAL | Status: DC | PRN

## 2022-12-24 MED ORDER — INSULIN ASPART 100 UNIT/ML IJ SOLN: 0.0000 [IU] | Freq: Every day | INTRAMUSCULAR | Status: DC

## 2022-12-24 MED ORDER — ALBUTEROL SULFATE (2.5 MG/3ML) 0.083% IN NEBU: 2.5000 mg | INHALATION_SOLUTION | Freq: Four times a day (QID) | RESPIRATORY_TRACT | Status: DC | PRN

## 2022-12-24 MED ORDER — ONDANSETRON HCL 4 MG/2ML IJ SOLN: 4.0000 mg | Freq: Four times a day (QID) | INTRAMUSCULAR | Status: DC | PRN

## 2022-12-24 NOTE — Assessment & Plan Note (Signed)
-  Continue home Crestor °

## 2022-12-24 NOTE — Consult Note (Signed)
Cardiology Consultation   Patient ID: CLYDENE BURACK MRN: [id number]; DOB: September 11, 1933  Admit date: 12/24/2022 Date of Consult: 12/24/2022  PCP:  Cheryl Funk, MD   [facility] Providers Cardiologist:  None        Patient Profile:   Cheryl Quinn is a 89 y.o. female with a hx of IPF who is being seen 12/24/2022 for the evaluation of atrial arrhythmia  at the request of Dr Cheryl Quinn.  History of Present Illness:   [name] reports that she has had increased SOB for the past 3 months. Reports that she had PNA but breathing continued to be poor. Was Dx with IPF. Tried on steroids but this did not help and ran her sugars up so this was discontinued. She is on Symbicort and albuterol. Has history of DM and HLD. No history of HTN per her history. Today she was seeing her primary care and was told she is  in Afib so sent to ED. She does note some lightheadedness. No chest pain. No edema. No history of bleeding or stroke.    Past Medical History:  Diagnosis Date   Arthritis    hips,shoulders   Asthma    Complete rupture of left rotator cuff 09/28/2015   Diabetes mellitus without complication (HCC)    GERD (gastroesophageal reflux disease)    with spicy foods   Glaucoma    Hyperlipidemia    Hypertension    PONV (postoperative nausea and vomiting)    Rotator cuff tear     Past Surgical History:  Procedure Laterality Date   ABDOMINAL HYSTERECTOMY     BLEPHAROPLASTY     BREAST EXCISIONAL BIOPSY Right 2003   Benign   BUNIONECTOMY WITH HAMMERTOE RECONSTRUCTION Bilateral    CHOLECYSTECTOMY     EYE SURGERY     cataract   SHOULDER ARTHROSCOPY WITH ROTATOR CUFF REPAIR Left 09/28/2015   Procedure: LEFT SHOULDER ARTHROSCOPY DEBRIDEMENT, ROTATOR CUFF REPAIR;  Surgeon: Cheryl Lucy, MD;  Location: [facility];  Service: Orthopedics;  Laterality: Left;   TRIGGER FINGER RELEASE Left    WRIST FRACTURE SURGERY Right      Home Medications:  Prior to  Admission medications   Medication Sig Start Date End Date Taking? Authorizing Provider  ACCU-CHEK GUIDE test strip USE AS DIRECTED TO CHECK BLOOD SUGARS DAILY 90 DAYS    [provider]  acetaminophen (TYLENOL) 500 MG tablet Take by mouth. 04/26/20   [provider]  albuterol (PROVENTIL HFA;VENTOLIN HFA) 108 (90 BASE) MCG/ACT inhaler Inhale into the lungs every 6 (six) hours as needed for wheezing or shortness of breath.    [provider]  Ascorbic Acid (VITAMIN C) 1000 MG tablet Take 1,000 mg by mouth daily.    [provider]  brimonidine (ALPHAGAN P) 0.1 % SOLN     [provider]  budesonide-formoterol (SYMBICORT) 160-4.5 MCG/ACT inhaler Inhale 2 puffs into the lungs in the morning and at bedtime. 12/10/22   Quinn, Cheryl Coyer, MD  CRESTOR 10 MG tablet Take 10 mg by mouth daily. 03/01/15   [provider]  diclofenac (FLECTOR) 1.3 % PTCH 1 PATCH TO SKIN TO MOST PAINFUL AREA AS NEEDED TRANSDERMAL 30 DAY(S)    [provider]  dorzolamide-timolol (COSOPT) 22.3-6.8 MG/ML ophthalmic solution INSTILL 1 DROP INTO BOTH EYES 2 TIMES DAILY. 04/02/15   [provider]  famotidine (PEPCID) 10 MG tablet Take 10 mg by mouth 2 (two) times daily.    [provider]  fluticasone (FLONASE) 50 MCG/ACT nasal spray Place into both nostrils daily.    [provider]  furosemide (LASIX) 20 MG tablet Take 20 mg by mouth daily. 05/20/22   [provider]  glimepiride (AMARYL) 2 MG tablet Take 2 mg by mouth every morning.    [provider]  KLOR-CON M10 10 MEQ tablet Take 10 mEq by mouth daily. 05/20/22   [provider]  LUMIGAN 0.01 % SOLN  05/06/15   [provider]  metFORMIN (GLUCOPHAGE-XR) 500 MG 24 hr tablet SMARTSIG:2 Tablet(s) By Mouth Every Evening 05/20/22   [provider]  Pirfenidone 267 MG TABS Month 1: Take 1 tab three times daily for 7 days, then 2 tabs three times daily for 7  days, then 3 tabs three times daily thereafter. 12/19/22   Quinn, Cheryl Robinsons, MD  Pirfenidone 267 MG TABS Take 3 tablets (801 mg total) by mouth in the morning, at noon, and at bedtime. 01/19/23   Quinn, Cheryl Robinsons, MD  predniSONE (DELTASONE) 10 MG tablet Take 40mg  daily x's 2 weeks, 30mg  x's 2 weeks, 20mg  x's 2 weeks, 10mg  x's 2 weeks 10/28/22   Quinn, Praveen, MD  sertraline (ZOLOFT) 50 MG tablet Take 50 mg by mouth daily. 06/26/22   [provider]  VITAMIN D PO Take 1 capsule by mouth every evening.    [provider]    Inpatient Medications: Scheduled Meds:  Continuous Infusions:  PRN Meds:   Allergies:    Allergies  Allergen Reactions   Oxycodone Shortness Of Breath, Nausea And Vomiting, Anxiety and Other (See Comments)    Sweating; hullicinations;    Duricef [Cefadroxil] Diarrhea   Lipitor [Atorvastatin] Other (See Comments)    Muscle weakenss   Lisinopril    Vicodin [Hydrocodone-Acetaminophen] Other (See Comments)    hallucinations    Social History:   Social History   Socioeconomic History   Marital status: Married    Spouse name: Not on file   Number of children: Not on file   Years of education: Not on file   Highest education level: Not on file  Occupational History   Not on file  Tobacco Use   Smoking status: Former    Types: Cigarettes    Quit date: 1998    Years since quitting: 26.0    Passive exposure: Never   Smokeless tobacco: Never   Tobacco comments:    Smoked only 1-2 cigarettes per day  Vaping Use   Vaping Use: Never used  Substance and Sexual Activity   Alcohol use: Yes    Comment: social   Drug use: No   Sexual activity: Not on file  Other Topics Concern   Not on file  Social History Narrative   Not on file   Social Determinants of Health   Financial Resource Strain: Not on file  Food Insecurity: Not on file  Transportation Needs: Not on file  Physical Activity: Not on file  Stress: Not on file  Social Connections:  Not on file  Intimate Partner Violence: Not on file    Family History:   Father had an "enlarged heart" Brother had a "hole in his heart" but died at age 72.  Family History  Problem Relation Age of Onset   COPD Sister      ROS:  Please see the history of present illness.  All other ROS reviewed and negative.     Physical Exam/Data:   Vitals:   12/24/22 1322 12/24/22 1322 12/24/22 1400 12/24/22 1445  BP:  (!) 121/93 108/60   Pulse:  98 92   Resp:  14  18  Temp:  98.7 F (37.1 C)    TempSrc:  Oral    SpO2:  94% 90%   Weight: 72.1 kg     Height: 5\' 1"  (1.549 m)      No intake or output data in the 24 hours ending 12/24/22 1726    12/24/2022    1:22 PM 12/05/2022    2:42 PM 10/28/2022    2:05 PM  Last 3 Weights  Weight (lbs) 159 lb 165 lb 166 lb  Weight (kg) 72.122 kg 74.844 kg 75.297 kg     Body mass index is 30.04 kg/m.  General:  Well nourished, elderly, in no acute distress HEENT: normal Neck: no JVD Vascular: No carotid bruits; Distal pulses 2+ bilaterally Cardiac:  normal S1, S2; IRRR; no murmur  Lungs:  dry basilar crackles.  Abd: soft, nontender, no hepatomegaly  Ext: no edema Musculoskeletal:  No deformities, BUE and BLE strength normal and equal Skin: warm and dry  Neuro:  CNs 2-12 intact, no focal abnormalities noted Psych:  Normal affect   EKG:  The EKG was personally reviewed and demonstrates:  NSR with PACs. Rate 97. LBBB which is new since 2016. Telemetry:  Telemetry was personally reviewed and demonstrates:  NSR with PACs. There are short bursts of rapid rhythm with irregularity then back to NSR. This could represent Afib versus MAT.  Relevant CV Studies: None   Laboratory Data:  High Sensitivity Troponin:   Recent Labs  Lab 12/24/22 1331 12/24/22 1610  TROPONINIHS 20* 18*     Chemistry Recent Labs  Lab 12/24/22 1331  NA 137  K 3.2*  CL 104  CO2 23  GLUCOSE 164*  BUN 20  CREATININE 1.03*  CALCIUM 9.2  GFRNONAA 53*  ANIONGAP  10    Recent Labs  Lab 12/23/22 1357  PROT 7.4  ALBUMIN 3.8  AST 17  ALT 19  ALKPHOS 50  BILITOT 0.4   Lipids No results for input(s): "CHOL", "TRIG", "HDL", "LABVLDL", "LDLCALC", "CHOLHDL" in the last 168 hours.  Hematology Recent Labs  Lab 12/24/22 1331  WBC 9.1  RBC 4.00  HGB 12.5  HCT 37.3  MCV 93.3  MCH 31.3  MCHC 33.5  RDW 13.9  PLT 377   Thyroid No results for input(s): "TSH", "FREET4" in the last 168 hours.  BNP Recent Labs  Lab 12/24/22 1331  BNP 47.4    DDimer No results for input(s): "DDIMER" in the last 168 hours.   Radiology/Studies:  DG Chest 2 View  Result Date: 12/24/2022 CLINICAL DATA:  Provided history: Atrial fibrillation. Additional history provided: Shortness of breath for two months. Left-sided chest pain. History of hypertension and diabetes. Former smoker. EXAM: CHEST - 2 VIEW COMPARISON:  Prior chest radiographs 06/05/2022 and earlier. Chest CT 08/26/2022. FINDINGS: Heart size within normal limits. Known fibrotic lung disease, more fully characterized on the prior chest CT of 08/26/2022. A 6 mm nodular opacity projects in the region of the lateral right lung base on the AP radiograph (see annotation on image). Ill-defined opacities within bilateral lung bases, which may reflect atelectasis or pneumonia. No evidence of pleural effusion or pneumothorax. No acute bony abnormality identified. Surgical clips within the upper abdomen. IMPRESSION: 1. Ill-defined opacities within the bilateral lung bases, which may reflect atelectasis or pneumonia. 2. A 6 mm nodular opacity projects in the region of the lateral right lung base on the  AP radiograph. This is concerning for a possible pulmonary nodule, and a chest CT is recommended for further evaluation. 3. Chronic fibrotic lung disease, more fully characterized on the prior chest CT of 08/26/2022. Electronically Signed   By: Kellie Simmering D.O.   On: 12/24/2022 15:26     Assessment and Plan:   Atrial  arrhythmia. Brief runs of fast irregular rhythm. Mostly in NSR with PACs. I suspect this is MAT given underlying pulmonary disease with IPF but could be Afib. Will monitor on telemetry overnight. Start diltiazem 30 mg every 6 hours. Can consolidate into long acting form depending on response. Check TSH. Check Echo. She is asymptomatic. Will follow with hospitalist  team. IPF DM type 2 HLD LBBB new since  2016. Will check Echo.    Risk Assessment/Risk Scores:          CHA2DS2-VASc Score = 4   This indicates a 4.8% annual risk of stroke. The patient's score is based upon: CHF History: 0 HTN History: 0 Diabetes History: 1 Stroke History: 0 Vascular Disease History: 0 Age Score: 2 Gender Score: 1         For questions or updates, please contact [place] Please consult [email or web address] for contact info under    Signed, Bridgid Printz [place], MD  12/24/2022 5:26 PM

## 2022-12-24 NOTE — Assessment & Plan Note (Signed)
Potassium at 3.2. -Check magnesium -Replace potassium

## 2022-12-24 NOTE — ED Triage Notes (Signed)
Pt BIB [facility] from her PCP d/t them finding new onset A-fib during her check-up. EMS reports finding Lt Bundle branch block as well. Pt does endorse exertional SOB & was placed on 2l O2 via n/c. A/Ox4, 129/64, 94 bpm - irregular, 92% on RA & 97% on the 2L, CBG 191, denies any pain.

## 2022-12-24 NOTE — H&P (Signed)
History and Physical    Patient: Cheryl Quinn JME:[id number] DOB: August 04, 1933 DOA: 12/24/2022 DOS: the patient was seen and examined on 12/24/2022 PCP: Lavone Orn, MD  Patient coming from: Home  Chief Complaint:  Chief Complaint  Patient presents with   A-Fib   SOB   HPI: Cheryl Quinn is a 89 y.o. female with medical history significant of IPF, not on home oxygen, type 2 diabetes mellitus, hypertension and dyslipidemia was sent to ED from primary care office with concern of shortness of breath and new onset A-fib.  Patient presented to PCP office with concern of worsening shortness of breath for the past few days.  No chest pain, orthopnea or PND.  In the office there is a concern of transient hypoxia and was placed on 2 L of oxygen.  She was saturating well in ED on room air.  EKG at PCP office with concern of new onset A-fib so she was sent to ED for further evaluation.  Patient denies any recent illnesses, upper respiratory symptoms, no fever or chills.  No sick contacts.  She was having poor appetite for the past couple of days but denies any nausea, vomiting or diarrhea.  No recent change in weight or bowel habits.  No urinary symptoms.  ED course and data reviewed.  Vital stable, labs mostly stable and normal except mild hypokalemia with potassium of 3.2.  Chest x-ray with ill-defined opacities bilateral bases, which may reflect atelectasis or pneumonia.  Chronic fibrotic lung disease and also noted a 6 mm nodular opacity which is concerning for a possible pulmonary nodule and a follow-up CT chest was recommended.  EKG. personally reviewed.  NSR with frequent premature atrial contractions and a new LBBB as compared to prior which was many years ago.  Review of Systems: As mentioned in the history of present illness. All other systems reviewed and are negative. Past Medical History:  Diagnosis Date   Arthritis    hips,shoulders   Asthma    Complete rupture of left rotator  cuff 09/28/2015   Diabetes mellitus without complication (HCC)    GERD (gastroesophageal reflux disease)    with spicy foods   Glaucoma    Hyperlipidemia    Hypertension    PONV (postoperative nausea and vomiting)    Rotator cuff tear    Past Surgical History:  Procedure Laterality Date   ABDOMINAL HYSTERECTOMY     BLEPHAROPLASTY     BREAST EXCISIONAL BIOPSY Right 2003   Benign   BUNIONECTOMY WITH HAMMERTOE RECONSTRUCTION Bilateral    CHOLECYSTECTOMY     EYE SURGERY     cataract   SHOULDER ARTHROSCOPY WITH ROTATOR CUFF REPAIR Left 09/28/2015   Procedure: LEFT SHOULDER ARTHROSCOPY DEBRIDEMENT, ROTATOR CUFF REPAIR;  Surgeon: Marchia Bond, MD;  Location: [facility];  Service: Orthopedics;  Laterality: Left;   TRIGGER FINGER RELEASE Left    WRIST FRACTURE SURGERY Right    Social History:  reports that she quit smoking about 26 years ago. Her smoking use included cigarettes. She has never been exposed to tobacco smoke. She has never used smokeless tobacco. She reports current alcohol use. She reports that she does not use drugs.  Allergies  Allergen Reactions   Oxycodone Shortness Of Breath, Nausea And Vomiting, Anxiety and Other (See Comments)    Sweating; hullicinations;    Duricef [Cefadroxil] Diarrhea   Lipitor [Atorvastatin] Other (See Comments)    Muscle weakenss   Lisinopril    Vicodin [Hydrocodone-Acetaminophen] Other (See Comments)  hallucinations    Family History  Problem Relation Age of Onset   COPD Sister     Prior to Admission medications   Medication Sig Start Date End Date Taking? Authorizing Provider  ACCU-CHEK GUIDE test strip USE AS DIRECTED TO CHECK BLOOD SUGARS DAILY 49 DAYS    [provider]  acetaminophen (TYLENOL) 500 MG tablet Take by mouth. 04/26/20   [provider]  albuterol (PROVENTIL HFA;VENTOLIN HFA) 108 (90 BASE) MCG/ACT inhaler Inhale into the lungs every 6 (six) hours as needed for wheezing or shortness of  breath.    [provider]  Ascorbic Acid (VITAMIN C) 1000 MG tablet Take 1,000 mg by mouth daily.    [provider]  brimonidine (ALPHAGAN P) 0.1 % SOLN     [provider]  budesonide-formoterol (SYMBICORT) 160-4.5 MCG/ACT inhaler Inhale 2 puffs into the lungs in the morning and at bedtime. 12/10/22   Mannam, Hart Robinsons, MD  CRESTOR 10 MG tablet Take 10 mg by mouth daily. 03/01/15   [provider]  diclofenac (FLECTOR) 1.3 % PTCH 1 PATCH TO SKIN TO MOST PAINFUL AREA AS NEEDED TRANSDERMAL 30 DAY(S)    [provider]  dorzolamide-timolol (COSOPT) 22.3-6.8 MG/ML ophthalmic solution INSTILL 1 DROP INTO BOTH EYES 2 TIMES DAILY. 04/02/15   [provider]  famotidine (PEPCID) 10 MG tablet Take 10 mg by mouth 2 (two) times daily.    [provider]  fluticasone (FLONASE) 50 MCG/ACT nasal spray Place into both nostrils daily.    [provider]  furosemide (LASIX) 20 MG tablet Take 20 mg by mouth daily. 05/20/22   [provider]  glimepiride (AMARYL) 2 MG tablet Take 2 mg by mouth every morning.    [provider]  KLOR-CON M10 10 MEQ tablet Take 10 mEq by mouth daily. 05/20/22   [provider]  LUMIGAN 0.01 % SOLN  05/06/15   [provider]  metFORMIN (GLUCOPHAGE-XR) 500 MG 24 hr tablet SMARTSIG:2 Tablet(s) By Mouth Every Evening 05/20/22   [provider]  Pirfenidone 267 MG TABS Month 1: Take 1 tab three times daily for 7 days, then 2 tabs three times daily for 7 days, then 3 tabs three times daily thereafter. 12/19/22   Mannam, Hart Robinsons, MD  Pirfenidone 267 MG TABS Take 3 tablets (801 mg total) by mouth in the morning, at noon, and at bedtime. 01/19/23   Mannam, Hart Robinsons, MD  predniSONE (DELTASONE) 10 MG tablet Take 40mg  daily x's 2 weeks, 30mg  x's 2 weeks, 20mg  x's 2 weeks, 10mg  x's 2 weeks 10/28/22   Mannam, Praveen, MD  sertraline (ZOLOFT) 50 MG tablet Take 50 mg by mouth daily. 06/26/22    [provider]  VITAMIN D PO Take 1 capsule by mouth every evening.    [provider]    Physical Exam: Vitals:   12/24/22 1445 12/24/22 1600 12/24/22 1630 12/24/22 1700  BP:  121/81 (!) 111/56 (!) 102/59  Pulse:  (!) 112 (!) 114 70  Resp: 18 19 20  (!) 22  Temp:      TempSrc:      SpO2:  90% (!) 89% 96%  Weight:      Height:       Vitals:   12/24/22 1445 12/24/22 1600 12/24/22 1630 12/24/22 1700  BP:  121/81 (!) 111/56 (!) 102/59  Pulse:  (!) 112 (!) 114 70  Resp: 18 19 20  (!) 22  Temp:      TempSrc:  SpO2:  90% (!) 89% 96%  Weight:      Height:       General: Vital signs reviewed.  Patient is well-developed and well-nourished, in no acute distress and cooperative with exam.  Head: Normocephalic and atraumatic. Eyes: EOMI, conjunctivae normal, no scleral icterus.  Neck: Supple, trachea midline, normal ROM, no JVD, masses, thyromegaly, or carotid bruit present.  Cardiovascular: Irregularly irregular Pulmonary/Chest: Clear to auscultation bilaterally, few scattered dry crackles. Abdominal: Soft, non-tender, non-distended, BS + Extremities: No lower extremity edema bilaterally,  pulses symmetric and intact bilaterally. No cyanosis or clubbing. Neurological: A&O x3, Strength is normal and symmetric bilaterally, cranial nerve II-XII are grossly intact, no focal motor deficit, sensory intact to light touch bilaterally.  Skin: Warm, dry and intact. No rashes or erythema. Psychiatric: Normal mood and affect.   Data Reviewed: Prior data reviewed  Assessment and Plan: * Atrial arrhythmia EKG with normal sinus rhythm and frequent PACs, monitor shows intermittently irregular rhythm and concern of A-fib.  Per cardiology note A-fib versus MAT.  CHA2DS2-VASc Score = 4   Cardiology is on board.  They were advising Cardizem 30 mg every 6 hourly for rate control.  -Admitted under observation for cardiac monitor -TSH -Echocardiogram was ordered -Cardizem 30 mg  every 6 hourly  Hypokalemia Potassium at 3.2. -Check magnesium -Replace potassium  ILD (interstitial lung disease) ([place]) Being managed by pulmonary.  Patient was initially started on prednisone and it was discontinued due to elevated blood glucose levels. She was recently given a prescription for pirfenidone which she has not started yet. Not on home oxygen -Continue home albuterol and Symbicort  Diabetes type 2, controlled ([place]) Patient was on Amaryl and metformin at home. -Check A1c -SSI  Hypertension She was taken off from home Lasix recently due to softer blood pressure. -Continue to monitor  Hyperlipidemia -Continue home Crestor    Advance Care Planning:   Code Status: DNR with full scope of medical care.  Discussed with patient and husband at bedside  Consults: Cardiology  Family Communication: Discussed with husband and daughter at bedside  Severity of Illness: The appropriate patient status for this patient is OBSERVATION. Observation status is judged to be reasonable and necessary in order to provide the required intensity of service to ensure the patient's safety. The patient's presenting symptoms, physical exam findings, and initial radiographic and laboratory data in the context of their medical condition is felt to place them at decreased risk for further clinical deterioration. Furthermore, it is anticipated that the patient will be medically stable for discharge from the hospital within 2 midnights of admission.   This record has been created using Systems analyst. Errors have been sought and corrected,but may not always be located. Such creation errors do not reflect on the standard of care.   Author: Lorella Nimrod, MD 12/24/2022 7:19 PM  For on call review [email or web address].

## 2022-12-24 NOTE — ED Provider Notes (Signed)
   Clinical Course as of 12/24/22 1933  Wed Dec 24, 2022  1651 Received sign out from Dr. Vanita Panda pending cardiology evaluation  [WS]  [phone] Cardiology recommends admission for telemetry and initiation of diltiazem. Admitted to hospitalist service.  [WS]    Clinical Course User Index [WS] Cristie Hem, MD   Medical Decision Making Amount and/or Complexity of Data Reviewed Labs: ordered. Radiology: ordered. ECG/medicine tests: ordered.  Risk OTC drugs. Prescription drug management. Decision regarding hospitalization.       Cristie Hem, MD 12/24/22 [phone]

## 2022-12-24 NOTE — ED Provider Triage Note (Signed)
Emergency Medicine Provider Triage Evaluation Note  Cheryl Quinn , a 89 y.o. female  was evaluated in triage.  Pt complains of shortness of breath.  Was diagnosed with pulmonary fibrosis 3 months ago and has had shortness of breath since then.  Was at her pulmonology office today and had an EKG done which showed atrial fibrillation.  This is new to the patient.  She was transferred by EMS for this.  Denies any change in her shortness of breath.  Also denies chest pain.  In triage, her EKG showed left bundle branch block with some ST depression.  Last EKG for comparison was 5 years ago.  She does not have any history of cardiac issues and does not follow cardiology.  She does not take any blood thinners.  Review of Systems  Positive: As above Negative: As above  Physical Exam  BP (!) 121/93 (BP Location: Left Arm)   Pulse 98   Temp 98.7 F (37.1 C) (Oral)   Resp 14   Ht 5\' 1"  (1.549 m)   Wt 72.1 kg   SpO2 94%   BMI 30.04 kg/m  Gen:   Awake, no distress   Resp:  Normal effort  MSK:   Moves extremities without difficulty  Other:    Medical Decision Making  Medically screening exam initiated at 1:40 PM.  Appropriate orders placed.  Cheryl Quinn was informed that the remainder of the evaluation will be completed by another provider, this initial triage assessment does not replace that evaluation, and the importance of remaining in the ED until their evaluation is complete.  Chest pain workup initiated   [name], Hershal Coria 12/24/22 1342

## 2022-12-24 NOTE — Assessment & Plan Note (Signed)
EKG with normal sinus rhythm and frequent PACs, monitor shows intermittently irregular rhythm and concern of A-fib.  Per cardiology note A-fib versus MAT.  CHA2DS2-VASc Score = 4   Cardiology is on board.  They were advising Cardizem 30 mg every 6 hourly for rate control.  -Admitted under observation for cardiac monitor -TSH -Echocardiogram was ordered -Cardizem 30 mg every 6 hourly

## 2022-12-24 NOTE — ED Provider Notes (Signed)
[facility] Provider Note   CSN: [id number] Arrival date & time: 12/24/22  1314     History  Chief Complaint  Patient presents with   A-Fib   SOB    Cheryl Quinn is a 89 y.o. female.  HPI Patient with history of interstitial lung disease with her husband assists with the history.  Patient arrives from clinic with concern for possible arrhythmia as well.  History is notable for interstitial lung disease diagnosed last year, after episode of pneumonia.  She notes that she over the last few days has been more weak than usual in spite of using her typical medications.  She denies history of cardiac disease, or any current chest pain though she has had worsening dyspnea with exertion and occasional chest tightness over the last few days as well.  Per EMS the patient had concern for A-fib in the clinic, required 2 L during transport, but was ANO x 3.     Home Medications Prior to Admission medications   Medication Sig Start Date End Date Taking? Authorizing Provider  ACCU-CHEK GUIDE test strip USE AS DIRECTED TO CHECK BLOOD SUGARS DAILY 28 DAYS    [provider]  acetaminophen (TYLENOL) 500 MG tablet Take by mouth. 04/26/20   [provider]  albuterol (PROVENTIL HFA;VENTOLIN HFA) 108 (90 BASE) MCG/ACT inhaler Inhale into the lungs every 6 (six) hours as needed for wheezing or shortness of breath.    [provider]  Ascorbic Acid (VITAMIN C) 1000 MG tablet Take 1,000 mg by mouth daily.    [provider]  brimonidine (ALPHAGAN P) 0.1 % SOLN     [provider]  budesonide-formoterol (SYMBICORT) 160-4.5 MCG/ACT inhaler Inhale 2 puffs into the lungs in the morning and at bedtime. 12/10/22   Mannam, Hart Robinsons, MD  CRESTOR 10 MG tablet Take 10 mg by mouth daily. 03/01/15   [provider]  diclofenac (FLECTOR) 1.3 % PTCH 1 PATCH TO SKIN TO MOST PAINFUL AREA AS NEEDED TRANSDERMAL 30 DAY(S)     [provider]  dorzolamide-timolol (COSOPT) 22.3-6.8 MG/ML ophthalmic solution INSTILL 1 DROP INTO BOTH EYES 2 TIMES DAILY. 04/02/15   [provider]  famotidine (PEPCID) 10 MG tablet Take 10 mg by mouth 2 (two) times daily.    [provider]  fluticasone (FLONASE) 50 MCG/ACT nasal spray Place into both nostrils daily.    [provider]  furosemide (LASIX) 20 MG tablet Take 20 mg by mouth daily. 05/20/22   [provider]  glimepiride (AMARYL) 2 MG tablet Take 2 mg by mouth every morning.    [provider]  KLOR-CON M10 10 MEQ tablet Take 10 mEq by mouth daily. 05/20/22   [provider]  LUMIGAN 0.01 % SOLN  05/06/15   [provider]  metFORMIN (GLUCOPHAGE-XR) 500 MG 24 hr tablet SMARTSIG:2 Tablet(s) By Mouth Every Evening 05/20/22   [provider]  Pirfenidone 267 MG TABS Month 1: Take 1 tab three times daily for 7 days, then 2 tabs three times daily for 7 days, then 3 tabs three times daily thereafter. 12/19/22   Mannam, Hart Robinsons, MD  Pirfenidone 267 MG TABS Take 3 tablets (801 mg total) by mouth in the morning, at noon, and at bedtime. 01/19/23   Mannam, Hart Robinsons, MD  predniSONE (DELTASONE) 10 MG tablet Take 40mg  daily x's 2 weeks, 30mg  x's 2 weeks, 20mg  x's 2 weeks, 10mg  x's 2 weeks 10/28/22   Mannam,  Praveen, MD  sertraline (ZOLOFT) 50 MG tablet Take 50 mg by mouth daily. 06/26/22   [provider]  VITAMIN D PO Take 1 capsule by mouth every evening.    [provider]      Allergies    Oxycodone, Duricef [cefadroxil], Lipitor [atorvastatin], Lisinopril, and Vicodin [hydrocodone-acetaminophen]    Review of Systems   Review of Systems  All other systems reviewed and are negative.   Physical Exam Updated Vital Signs BP 108/60   Pulse 92   Temp 98.7 F (37.1 C) (Oral)   Resp 18   Ht 5\' 1"  (1.549 m)   Wt 72.1 kg   SpO2 90%   BMI 30.04 kg/m  Physical Exam Vitals and nursing note  reviewed.  Constitutional:      General: She is not in acute distress.    Appearance: She is well-developed.  HENT:     Head: Normocephalic and atraumatic.  Eyes:     Conjunctiva/sclera: Conjunctivae normal.  Cardiovascular:     Rate and Rhythm: Normal rate and regular rhythm.     Pulses: Normal pulses.  Pulmonary:     Effort: Pulmonary effort is normal. No respiratory distress.     Breath sounds: No stridor.  Abdominal:     General: There is no distension.  Skin:    General: Skin is warm and dry.  Neurological:     Mental Status: She is alert and oriented to person, place, and time.     Cranial Nerves: No cranial nerve deficit.  Psychiatric:        Mood and Affect: Mood normal.     ED Results / Procedures / Treatments   Labs (all labs ordered are listed, but only abnormal results are displayed) Labs Reviewed  BASIC METABOLIC PANEL - Abnormal; Notable for the following components:      Result Value   Potassium 3.2 (*)    Glucose, Bld 164 (*)    Creatinine, Ser 1.03 (*)    GFR, Estimated 53 (*)    All other components within normal limits  TROPONIN I (HIGH SENSITIVITY) - Abnormal; Notable for the following components:   Troponin I (High Sensitivity) 20 (*)    All other components within normal limits  TROPONIN I (HIGH SENSITIVITY) - Abnormal; Notable for the following components:   Troponin I (High Sensitivity) 18 (*)    All other components within normal limits  CBC  BRAIN NATRIURETIC PEPTIDE    EKG EKG Interpretation  Date/Time:  Wednesday December 24 2022 13:07:18 EST Ventricular Rate:  97 PR Interval:  140 QRS Duration: 134 QT Interval:  388 QTC Calculation: 492 R Axis:   52 Text Interpretation: Sinus rhythm with Premature atrial complexes Left bundle branch block Abnormal ECG When compared with ECG of 28-Sep-2015 13:17, PREVIOUS ECG IS PRESENT Confirmed by Carmin Muskrat [phone]) on 12/24/2022 2:18:04 PM  Radiology DG Chest 2 View  Result Date:  12/24/2022 CLINICAL DATA:  Provided history: Atrial fibrillation. Additional history provided: Shortness of breath for two months. Left-sided chest pain. History of hypertension and diabetes. Former smoker. EXAM: CHEST - 2 VIEW COMPARISON:  Prior chest radiographs 06/05/2022 and earlier. Chest CT 08/26/2022. FINDINGS: Heart size within normal limits. Known fibrotic lung disease, more fully characterized on the prior chest CT of 08/26/2022. A 6 mm nodular opacity projects in the region of the lateral right lung base on the AP radiograph (see annotation on image). Ill-defined opacities within bilateral lung bases, which may reflect atelectasis or pneumonia. No  evidence of pleural effusion or pneumothorax. No acute bony abnormality identified. Surgical clips within the upper abdomen. IMPRESSION: 1. Ill-defined opacities within the bilateral lung bases, which may reflect atelectasis or pneumonia. 2. A 6 mm nodular opacity projects in the region of the lateral right lung base on the AP radiograph. This is concerning for a possible pulmonary nodule, and a chest CT is recommended for further evaluation. 3. Chronic fibrotic lung disease, more fully characterized on the prior chest CT of 08/26/2022. Electronically Signed   By: Kellie Simmering D.O.   On: 12/24/2022 15:26    Procedures Procedures    Medications Ordered in ED Medications - No data to display  ED Course/ Medical Decision Making/ A&P Clinical Course as of 12/24/22 1728  Wed Dec 24, 2022  1651 Received sign out from Dr. Vanita Panda pending cardiology evaluation  [WS]    Clinical Course User Index [WS] Cristie Hem, MD                             Medical Decision Making Patient with a history of interstitial lung disease, rheumatoid factor positive, hypertension, diabetes presents with fatigue, worsening over the past few days.  Concern for new arrhythmia, worsening interstitial lung disease, pneumonia, bacteremia, sepsis, electrolyte  abnormalities.  On monitor the patient has sinus rhythm with ectopy, rate 90s, abnormal  Pulse ox 94% 2 L abnormal   Amount and/or Complexity of Data Reviewed Independent Historian: spouse and EMS External Data Reviewed: notes. Labs: ordered. Decision-making details documented in ED Course. Radiology: ordered and independent interpretation performed. Decision-making details documented in ED Course. ECG/medicine tests: ordered and independent interpretation performed. Decision-making details documented in ED Course.   5:28 PM Patient in similar condition, now on monitor the patient has evidence for atrial fibrillation, see rhythm strip in chart.  This elderly female presents with fatigue, dyspnea, no chest pain.  However, the patient has known pulmonary fibrosis, likely contributing to her overall symptoms but with evidence for new A-fib on rhythm strip before arrival, concern for this on ED evaluation patient had resuscitation with fluids, required cardiology evaluation.  On signout the patient is awaiting this evaluation.  Patient not immediate candidate for cardioversion given the duration of symptoms.         Final Clinical Impression(s) / ED Diagnoses Final diagnoses:  New onset a-fib [facility])    Rx / DC Orders ED Discharge Orders     None         Carmin Muskrat, MD 12/24/22 1729

## 2022-12-24 NOTE — Assessment & Plan Note (Signed)
She was taken off from home Lasix recently due to softer blood pressure. -Continue to monitor

## 2022-12-24 NOTE — Assessment & Plan Note (Signed)
Patient was on Amaryl and metformin at home. -Check A1c -SSI

## 2022-12-24 NOTE — Assessment & Plan Note (Signed)
Being managed by pulmonary.  Patient was initially started on prednisone and it was discontinued due to elevated blood glucose levels. She was recently given a prescription for pirfenidone which she has not started yet. Not on home oxygen -Continue home albuterol and Symbicort

## 2022-12-25 ENCOUNTER — Observation Stay (HOSPITAL_BASED_OUTPATIENT_CLINIC_OR_DEPARTMENT_OTHER): Payer: Medicare PPO

## 2022-12-25 DIAGNOSIS — E119 Type 2 diabetes mellitus without complications: Secondary | ICD-10-CM | POA: Diagnosis not present

## 2022-12-25 DIAGNOSIS — I4719 Other supraventricular tachycardia: Secondary | ICD-10-CM | POA: Diagnosis not present

## 2022-12-25 DIAGNOSIS — I5020 Unspecified systolic (congestive) heart failure: Secondary | ICD-10-CM | POA: Diagnosis not present

## 2022-12-25 DIAGNOSIS — J84112 Idiopathic pulmonary fibrosis: Secondary | ICD-10-CM | POA: Diagnosis not present

## 2022-12-25 DIAGNOSIS — E876 Hypokalemia: Secondary | ICD-10-CM | POA: Diagnosis not present

## 2022-12-25 DIAGNOSIS — I4891 Unspecified atrial fibrillation: Secondary | ICD-10-CM

## 2022-12-25 DIAGNOSIS — I498 Other specified cardiac arrhythmias: Secondary | ICD-10-CM | POA: Diagnosis not present

## 2022-12-25 DIAGNOSIS — I499 Cardiac arrhythmia, unspecified: Secondary | ICD-10-CM | POA: Diagnosis not present

## 2022-12-25 LAB — ECHOCARDIOGRAM COMPLETE
AV Vena cont: 0.1 cm
Area-P 1/2: 3.1 cm2
Calc EF: 58.4 %
Height: 61 in
MV VTI: 1.98 cm2
P 1/2 time: 824 msec
S' Lateral: 4.2 cm
Single Plane A2C EF: 60.1 %
Single Plane A4C EF: 53.8 %
Weight: 2544 oz

## 2022-12-25 LAB — BASIC METABOLIC PANEL
Anion gap: 9 (ref 5–15)
BUN: 21 mg/dL (ref 8–23)
CO2: 23 mmol/L (ref 22–32)
Calcium: 8.8 mg/dL — ABNORMAL LOW (ref 8.9–10.3)
Chloride: 106 mmol/L (ref 98–111)
Creatinine, Ser: 1 mg/dL (ref 0.44–1.00)
GFR, Estimated: 55 mL/min — ABNORMAL LOW (ref >60)
Glucose, Bld: 163 mg/dL — ABNORMAL HIGH (ref 70–99)
Potassium: 3.5 mmol/L (ref 3.5–5.1)
Sodium: 138 mmol/L (ref 135–145)

## 2022-12-25 LAB — GLUCOSE, CAPILLARY
Glucose-Capillary: 144 mg/dL — ABNORMAL HIGH (ref 70–99)
Glucose-Capillary: 94 mg/dL (ref 70–99)
Glucose-Capillary: 171 mg/dL — ABNORMAL HIGH (ref 70–99)

## 2022-12-25 LAB — CBC
HCT: 31.2 % — ABNORMAL LOW (ref 36.0–46.0)
Hemoglobin: 10.5 g/dL — ABNORMAL LOW (ref 12.0–15.0)
MCH: 31.3 pg (ref 26.0–34.0)
MCHC: 33.7 g/dL (ref 30.0–36.0)
MCV: 92.9 fL (ref 80.0–100.0)
Platelets: 310 10*3/uL (ref 150–400)
RBC: 3.36 MIL/uL — ABNORMAL LOW (ref 3.87–5.11)
RDW: 14.2 % (ref 11.5–15.5)
WBC: 7.8 10*3/uL (ref 4.0–10.5)
nRBC: 0 % (ref 0.0–0.2)

## 2022-12-25 LAB — CBG MONITORING, ED: Glucose-Capillary: 158 mg/dL — ABNORMAL HIGH (ref 70–99)

## 2022-12-25 MED ORDER — LOSARTAN POTASSIUM 25 MG PO TABS: 25.0000 mg | ORAL_TABLET | Freq: Every day | ORAL | Status: DC

## 2022-12-25 MED ORDER — SPIRONOLACTONE 12.5 MG HALF TABLET
12.5000 mg | ORAL_TABLET | Freq: Every day | ORAL | Status: DC
  Administered 2022-12-26: 12.5 mg via ORAL
  Filled 2022-12-25: qty 1

## 2022-12-25 MED ORDER — DILTIAZEM HCL ER COATED BEADS 120 MG PO CP24
120.0000 mg | ORAL_CAPSULE | Freq: Every day | ORAL | Status: DC
  Administered 2022-12-25: 120 mg via ORAL
  Filled 2022-12-25: qty 1

## 2022-12-25 MED ORDER — POTASSIUM CHLORIDE 20 MEQ PO PACK
20.0000 meq | PACK | Freq: Once | ORAL | Status: AC
  Administered 2022-12-25: 20 meq via ORAL
  Filled 2022-12-25: qty 1

## 2022-12-25 MED ORDER — LOSARTAN POTASSIUM 25 MG PO TABS
25.0000 mg | ORAL_TABLET | Freq: Every day | ORAL | Status: DC
  Administered 2022-12-26: 25 mg via ORAL
  Filled 2022-12-25: qty 1

## 2022-12-25 NOTE — ED Notes (Signed)
ED TO INPATIENT HANDOFF REPORT  ED Nurse Name and Phone #: Hansel Starling [phone]  S Name/Age/Gender Cheryl Quinn 89 y.o. female Room/Bed: 037C/037C  Code Status   Code Status: DNR  Home/SNF/Other Home Patient oriented to: self, place, time, and situation Is this baseline? Yes   Triage Complete: Triage complete  Chief Complaint Atrial arrhythmia [I49.8]  Triage Note Pt BIB [facility] from her PCP d/t them finding new onset A-fib during her check-up. EMS reports finding Lt Bundle branch block as well. Pt does endorse exertional SOB & was placed on 2l O2 via n/c. A/Ox4, 129/64, 94 bpm - irregular, 92% on RA & 97% on the 2L, CBG 191, denies any pain.     Allergies Allergies  Allergen Reactions   Oxycodone Shortness Of Breath, Nausea And Vomiting, Anxiety and Other (See Comments)    Sweating; hullicinations;    Duricef [Cefadroxil] Diarrhea   Lipitor [Atorvastatin] Other (See Comments)    Muscle weakenss   Lisinopril Cough   Vicodin [Hydrocodone-Acetaminophen] Other (See Comments)    hallucinations    Level of Care/Admitting Diagnosis ED Disposition     ED Disposition  Admit   Condition  --   Comment  Hospital Area: [facility] [[id number]]  Level of Care: Telemetry Cardiac [103]  May place patient in observation at [facility] or [facility] if equivalent level of care is available:: Yes  Covid Evaluation: Asymptomatic - no recent exposure (last 10 days) testing not required  Diagnosis: Atrial arrhythmia [167211]  Admitting Physician: Arnetha Courser [[id number]]  Attending Physician: Arnetha Courser [[id number]]          B Medical/Surgery History Past Medical History:  Diagnosis Date   Arthritis    hips,shoulders   Asthma    Complete rupture of left rotator cuff 09/28/2015   Diabetes mellitus without complication (HCC)    GERD (gastroesophageal reflux disease)    with spicy foods   Glaucoma    Hyperlipidemia    Hypertension    PONV (postoperative  nausea and vomiting)    Rotator cuff tear    Past Surgical History:  Procedure Laterality Date   ABDOMINAL HYSTERECTOMY     BLEPHAROPLASTY     BREAST EXCISIONAL BIOPSY Right 2003   Benign   BUNIONECTOMY WITH HAMMERTOE RECONSTRUCTION Bilateral    CHOLECYSTECTOMY     EYE SURGERY     cataract   SHOULDER ARTHROSCOPY WITH ROTATOR CUFF REPAIR Left 09/28/2015   Procedure: LEFT SHOULDER ARTHROSCOPY DEBRIDEMENT, ROTATOR CUFF REPAIR;  Surgeon: Teryl Lucy, MD;  Location: [facility];  Service: Orthopedics;  Laterality: Left;   TRIGGER FINGER RELEASE Left    WRIST FRACTURE SURGERY Right      A IV Location/Drains/Wounds Patient Lines/Drains/Airways Status     Active Line/Drains/Airways     Name Placement date Placement time Site Days   Peripheral IV 12/24/22 22 G 1" Posterior;Right Wrist 12/24/22  1804  Wrist  1   Incision (Closed) 09/28/15 Shoulder Left 09/28/15  1045  -- 2645            Intake/Output Last 24 hours No intake or output data in the 24 hours ending 12/25/22 1053  Labs/Imaging Results for orders placed or performed during the hospital encounter of 12/24/22 (from the past 48 hour(s))  Basic metabolic panel     Status: Abnormal   Collection Time: 12/24/22  1:31 PM  Result Value Ref Range   Sodium 137 135 - 145 mmol/L   Potassium 3.2 (L) 3.5 -  5.1 mmol/L   Chloride 104 98 - 111 mmol/L   CO2 23 22 - 32 mmol/L   Glucose, Bld 164 (H) 70 - 99 mg/dL    Comment: Glucose reference range applies only to samples taken after fasting for at least 8 hours.   BUN 20 8 - 23 mg/dL   Creatinine, Ser 1.03 (H) 0.44 - 1.00 mg/dL   Calcium 9.2 8.9 - 10.3 mg/dL   GFR, Estimated 53 (L) >60 mL/min    Comment: (NOTE) Calculated using the CKD-EPI Creatinine Equation (2021)    Anion gap 10 5 - 15    Comment: Performed at [facility] [address], [place] [address]  CBC     Status: None   Collection Time: 12/24/22  1:31 PM  Result Value Ref Range    WBC 9.1 4.0 - 10.5 K/uL   RBC 4.00 3.87 - 5.11 MIL/uL   Hemoglobin 12.5 12.0 - 15.0 g/dL   HCT 37.3 36.0 - 46.0 %   MCV 93.3 80.0 - 100.0 fL   MCH 31.3 26.0 - 34.0 pg   MCHC 33.5 30.0 - 36.0 g/dL   RDW 13.9 11.5 - 15.5 %   Platelets 377 150 - 400 K/uL   nRBC 0.0 0.0 - 0.2 %    Comment: Performed at [facility], [place] [address], [place], [place] [address]  Troponin I (High Sensitivity)     Status: Abnormal   Collection Time: 12/24/22  1:31 PM  Result Value Ref Range   Troponin I (High Sensitivity) 20 (H) <18 ng/L    Comment: (NOTE) Elevated high sensitivity troponin I (hsTnI) values and significant  changes across serial measurements may suggest ACS but many other  chronic and acute conditions are known to elevate hsTnI results.  Refer to the "Links" section for chest pain algorithms and additional  guidance. Performed at [facility], [place] [address] [place], [place] [address]   Brain natriuretic peptide     Status: None   Collection Time: 12/24/22  1:31 PM  Result Value Ref Range   B Natriuretic Peptide 47.4 0.0 - 100.0 pg/mL    Comment: Performed at [facility] [address], [place], [place] [address]  Troponin I (High Sensitivity)     Status: Abnormal   Collection Time: 12/24/22  4:10 PM  Result Value Ref Range   Troponin I (High Sensitivity) 18 (H) <18 ng/L    Comment: (NOTE) Elevated high sensitivity troponin I (hsTnI) values and significant  changes across serial measurements may suggest ACS but many other  chronic and acute conditions are known to elevate hsTnI results.  Refer to the "Links" section for chest pain algorithms and additional  guidance. Performed at [facility], [place] [address] [place], [place] [address]   TSH     Status: None   Collection Time: 12/24/22  6:02 PM  Result Value Ref Range   TSH 1.611 0.350 - 4.500 uIU/mL    Comment: Performed by a 3rd Generation assay with a functional sensitivity of <=0.01  uIU/mL. Performed at [facility], [place] [address] [place], [place] [address]   Magnesium     Status: None   Collection Time: 12/24/22  7:41 PM  Result Value Ref Range   Magnesium 1.8 1.7 - 2.4 mg/dL    Comment: HEMOLYSIS AT THIS LEVEL MAY AFFECT RESULT Performed at [facility], [place] [address] [place], [place] [address]  Hemoglobin A1c     Status: Abnormal   Collection Time: 12/24/22  7:41 PM  Result Value Ref Range   Hgb A1c MFr Bld 7.5 (H) 4.8 - 5.6 %    Comment: (NOTE) Pre diabetes:          5.7%-6.4%  Diabetes:              >6.4%  Glycemic control for   <7.0% adults with diabetes    Mean Plasma Glucose 168.55 mg/dL    Comment: Performed at [facility] [address] [place], [place] [address]  CBC     Status: Abnormal   Collection Time: 12/24/22  7:41 PM  Result Value Ref Range   WBC 8.8 4.0 - 10.5 K/uL   RBC 3.75 (L) 3.87 - 5.11 MIL/uL   Hemoglobin 12.0 12.0 - 15.0 g/dL   HCT 34.7 (L) 36.0 - 46.0 %   MCV 92.5 80.0 - 100.0 fL   MCH 32.0 26.0 - 34.0 pg   MCHC 34.6 30.0 - 36.0 g/dL   RDW 14.3 11.5 - 15.5 %   Platelets 360 150 - 400 K/uL   nRBC 0.0 0.0 - 0.2 %    Comment: Performed at [facility], [place] [address] [place], [place] [address]  Creatinine, serum     Status: Abnormal   Collection Time: 12/24/22  7:41 PM  Result Value Ref Range   Creatinine, Ser 1.03 (H) 0.44 - 1.00 mg/dL   GFR, Estimated 53 (L) >60 mL/min    Comment: (NOTE) Calculated using the CKD-EPI Creatinine Equation (2021) Performed at [facility] [address] [place], [place] [address]   CBG monitoring, ED     Status: Abnormal   Collection Time: 12/24/22 11:51 PM  Result Value Ref Range   Glucose-Capillary 139 (H) 70 - 99 mg/dL    Comment: Glucose reference range applies only to samples taken after fasting for at least 8 hours.  Basic metabolic panel     Status: Abnormal   Collection Time: 12/25/22  4:59 AM  Result Value Ref Range   Sodium 138 135 -  145 mmol/L   Potassium 3.5 3.5 - 5.1 mmol/L   Chloride 106 98 - 111 mmol/L   CO2 23 22 - 32 mmol/L   Glucose, Bld 163 (H) 70 - 99 mg/dL    Comment: Glucose reference range applies only to samples taken after fasting for at least 8 hours.   BUN 21 8 - 23 mg/dL   Creatinine, Ser 1.00 0.44 - 1.00 mg/dL   Calcium 8.8 (L) 8.9 - 10.3 mg/dL   GFR, Estimated 55 (L) >60 mL/min    Comment: (NOTE) Calculated using the CKD-EPI Creatinine Equation (2021)    Anion gap 9 5 - 15    Comment: Performed at [facility] [address], [place], [place] [address]  CBC     Status: Abnormal   Collection Time: 12/25/22  4:59 AM  Result Value Ref Range   WBC 7.8 4.0 - 10.5 K/uL   RBC 3.36 (L) 3.87 - 5.11 MIL/uL   Hemoglobin 10.5 (L) 12.0 - 15.0 g/dL   HCT 31.2 (L) 36.0 - 46.0 %   MCV 92.9 80.0 - 100.0 fL   MCH 31.3 26.0 - 34.0 pg   MCHC 33.7 30.0 - 36.0 g/dL   RDW 14.2 11.5 - 15.5 %   Platelets 310 150 - 400 K/uL   nRBC 0.0 0.0 - 0.2 %  Comment: Performed at [facility], [address] [place], [place] [address]  CBG monitoring, ED     Status: Abnormal   Collection Time: 12/25/22  7:40 AM  Result Value Ref Range   Glucose-Capillary 158 (H) 70 - 99 mg/dL    Comment: Glucose reference range applies only to samples taken after fasting for at least 8 hours.   DG Chest 2 View  Result Date: 12/24/2022 CLINICAL DATA:  Provided history: Atrial fibrillation. Additional history provided: Shortness of breath for two months. Left-sided chest pain. History of hypertension and diabetes. Former smoker. EXAM: CHEST - 2 VIEW COMPARISON:  Prior chest radiographs 06/05/2022 and earlier. Chest CT 08/26/2022. FINDINGS: Heart size within normal limits. Known fibrotic lung disease, more fully characterized on the prior chest CT of 08/26/2022. A 6 mm nodular opacity projects in the region of the lateral right lung base on the AP radiograph (see annotation on image). Ill-defined opacities within bilateral lung  bases, which may reflect atelectasis or pneumonia. No evidence of pleural effusion or pneumothorax. No acute bony abnormality identified. Surgical clips within the upper abdomen. IMPRESSION: 1. Ill-defined opacities within the bilateral lung bases, which may reflect atelectasis or pneumonia. 2. A 6 mm nodular opacity projects in the region of the lateral right lung base on the AP radiograph. This is concerning for a possible pulmonary nodule, and a chest CT is recommended for further evaluation. 3. Chronic fibrotic lung disease, more fully characterized on the prior chest CT of 08/26/2022. Electronically Signed   By: Jackey Loge D.O.   On: 12/24/2022 15:26    Pending Labs Unresulted Labs (From admission, onward)     Start     Ordered   12/31/22 0500  Creatinine, serum  (enoxaparin (LOVENOX)    CrCl >/= 30 ml/min)  Weekly,   R     Comments: while on enoxaparin therapy    12/24/22 1900            Vitals/Pain Today's Vitals   12/25/22 0800 12/25/22 0830 12/25/22 0900 12/25/22 0930  BP: (!) 109/52 (!) 118/50 (!) 110/42 103/79  Pulse: 61 (!) 56 (!) 52 (!) 55  Resp: (!) 22 18 13 18   Temp:      TempSrc:      SpO2: 96% 94% 96% 96%  Weight:      Height:      PainSc:        Isolation Precautions No active isolations  Medications Medications  insulin aspart (novoLOG) injection 0-9 Units (2 Units Subcutaneous Given 12/25/22 0800)  insulin aspart (novoLOG) injection 0-5 Units ( Subcutaneous Not Given 12/25/22 0006)  enoxaparin (LOVENOX) injection 40 mg (40 mg Subcutaneous Given 12/24/22 1943)  acetaminophen (TYLENOL) tablet 650 mg (has no administration in time range)    Or  acetaminophen (TYLENOL) suppository 650 mg (has no administration in time range)  polyethylene glycol (MIRALAX / GLYCOLAX) packet 17 g (has no administration in time range)  ondansetron (ZOFRAN) tablet 4 mg (has no administration in time range)    Or  ondansetron (ZOFRAN) injection 4 mg (has no administration in time  range)  albuterol (PROVENTIL) (2.5 MG/3ML) 0.083% nebulizer solution 2.5 mg (has no administration in time range)  rosuvastatin (CRESTOR) tablet 10 mg (10 mg Oral Given 12/25/22 1007)  famotidine (PEPCID) tablet 10 mg (10 mg Oral Not Given 12/25/22 1007)  sertraline (ZOLOFT) tablet 50 mg (50 mg Oral Given 12/25/22 1007)  fluticasone furoate-vilanterol (BREO ELLIPTA) 200-25 MCG/ACT 1 puff (1 puff Inhalation Given  12/25/22 0800)  diltiazem (CARDIZEM CD) 24 hr capsule 120 mg (120 mg Oral Given 12/25/22 1008)  potassium chloride (KLOR-CON) packet 40 mEq (40 mEq Oral Given 12/24/22 1945)  potassium chloride (KLOR-CON) packet 20 mEq (20 mEq Oral Given 12/25/22 0828)    Mobility Walks with assist      Focused Assessments    R Recommendations: See Admitting Provider Note  Report given to:   Additional Notes: pt takes meds whole. Alert, purewick, husband at bedside

## 2022-12-25 NOTE — Progress Notes (Signed)
  Echocardiogram 2D Echocardiogram has been performed.  Cheryl Quinn 12/25/2022, 10:11 AM

## 2022-12-25 NOTE — Plan of Care (Addendum)
   Echocardiogram showed EF 35-40%, no regional wall motion abnormalities, grade I diastolic dysfunction, septal dyssynergy due to LBBB. Given reduced EF, recommended stopping diltiazem and starting GDMT as BP tolerates. BP low-normal, start low dose losartan, spiro. Did not tolerate ACEi in the past due to cough. Not an ideal candidate for BB given ILD.    Margie Billet, PA-C 12/25/2022 4:33 PM

## 2022-12-25 NOTE — Progress Notes (Signed)
Rounding Note    Patient Name: Cheryl Quinn Date of Encounter: 12/25/2022  [place] Cardiologist: None New  Subjective   Feels OK this am. Has her chronic SOB. No chest pain or palpitation  Inpatient Medications    Scheduled Meds:  diltiazem  30 mg Oral Q6H   enoxaparin (LOVENOX) injection  40 mg Subcutaneous Q24H   famotidine  10 mg Oral BID   fluticasone furoate-vilanterol  1 puff Inhalation Daily   insulin aspart  0-5 Units Subcutaneous QHS   insulin aspart  0-9 Units Subcutaneous TID WC   rosuvastatin  10 mg Oral Daily   sertraline  50 mg Oral Daily   Continuous Infusions:  PRN Meds: acetaminophen **OR** acetaminophen, albuterol, ondansetron **OR** ondansetron (ZOFRAN) IV, polyethylene glycol   Vital Signs    Vitals:   12/25/22 0445 12/25/22 0700 12/25/22 0730 12/25/22 0742  BP: (!) 117/54 (!) 102/41 (!) 107/48   Pulse: 63 (!) 58 (!) 57   Resp:   15   Temp:    97.8 F (36.6 C)  TempSrc:    Oral  SpO2: 96% 96% 96%   Weight:      Height:       No intake or output data in the 24 hours ending 12/25/22 0846    12/24/2022    1:22 PM 12/05/2022    2:42 PM 10/28/2022    2:05 PM  Last 3 Weights  Weight (lbs) 159 lb 165 lb 166 lb  Weight (kg) 72.122 kg 74.844 kg 75.297 kg      Telemetry    NSR with PACs - Personally Reviewed  ECG    NSR with PACs , LBBB - Personally Reviewed  Physical Exam   GEN: No acute distress.  Lying flat Neck: No JVD Cardiac: RRR, no murmurs, rubs, or gallops.  Respiratory: bibasilar dry crackles GI: Soft, nontender, non-distended  MS: No edema; No deformity. Neuro:  Nonfocal  Psych: Normal affect   Labs    High Sensitivity Troponin:   Recent Labs  Lab 12/24/22 1331 12/24/22 1610  TROPONINIHS 20* 18*     Chemistry Recent Labs  Lab 12/23/22 1357 12/24/22 1331 12/24/22 1941 12/25/22 0459  NA  --  137  --  138  K  --  3.2*  --  3.5  CL  --  104  --  106  CO2  --  23  --  23  GLUCOSE  --  164*   --  163*  BUN  --  20  --  21  CREATININE  --  1.03* 1.03* 1.00  CALCIUM  --  9.2  --  8.8*  MG  --   --  1.8  --   PROT 7.4  --   --   --   ALBUMIN 3.8  --   --   --   AST 17  --   --   --   ALT 19  --   --   --   ALKPHOS 50  --   --   --   BILITOT 0.4  --   --   --   GFRNONAA  --  53* 53* 55*  ANIONGAP  --  10  --  9    Lipids No results for input(s): "CHOL", "TRIG", "HDL", "LABVLDL", "LDLCALC", "CHOLHDL" in the last 168 hours.  Hematology Recent Labs  Lab 12/24/22 1331 12/24/22 1941 12/25/22 0459  WBC 9.1 8.8 7.8  RBC 4.00 3.75* 3.36*  HGB 12.5 12.0 10.5*  HCT 37.3 34.7* 31.2*  MCV 93.3 92.5 92.9  MCH 31.3 32.0 31.3  MCHC 33.5 34.6 33.7  RDW 13.9 14.3 14.2  PLT 377 360 310   Thyroid  Recent Labs  Lab 12/24/22 1802  TSH 1.611    BNP Recent Labs  Lab 12/24/22 1331  BNP 47.4    DDimer No results for input(s): "DDIMER" in the last 168 hours.   Radiology    DG Chest 2 View  Result Date: 12/24/2022 CLINICAL DATA:  Provided history: Atrial fibrillation. Additional history provided: Shortness of breath for two months. Left-sided chest pain. History of hypertension and diabetes. Former smoker. EXAM: CHEST - 2 VIEW COMPARISON:  Prior chest radiographs 06/05/2022 and earlier. Chest CT 08/26/2022. FINDINGS: Heart size within normal limits. Known fibrotic lung disease, more fully characterized on the prior chest CT of 08/26/2022. A 6 mm nodular opacity projects in the region of the lateral right lung base on the AP radiograph (see annotation on image). Ill-defined opacities within bilateral lung bases, which may reflect atelectasis or pneumonia. No evidence of pleural effusion or pneumothorax. No acute bony abnormality identified. Surgical clips within the upper abdomen. IMPRESSION: 1. Ill-defined opacities within the bilateral lung bases, which may reflect atelectasis or pneumonia. 2. A 6 mm nodular opacity projects in the region of the lateral right lung base on the AP  radiograph. This is concerning for a possible pulmonary nodule, and a chest CT is recommended for further evaluation. 3. Chronic fibrotic lung disease, more fully characterized on the prior chest CT of 08/26/2022. Electronically Signed   By: Kellie Simmering D.O.   On: 12/24/2022 15:26    Cardiac Studies   Echo pending  Patient Profile     89 y.o. female  with a hx of IPF who is being seen 12/24/2022 for the evaluation of atrial arrhythmia  at the request of Dr Vanita Panda.   Assessment & Plan    Atrial arrhythmia. Brief runs of fast irregular rhythm. Mostly in NSR with PACs. This appears to be MAT given underlying pulmonary disease with IPF. I don't think this is Afib.  Will treat with diltiazem SR 120 mg daily. Avoid beta blocker given lung disease.  TSH normal.  Check Echo today. She is asymptomatic. OK to DC today pending Echo results IPF DM type 2 HLD LBBB new since  2016. Will check Echo.      For questions or updates, please contact [facility] Please consult [email or web address] for contact info under        Signed, Sumi Lye [place], MD  12/25/2022, 8:46 AM

## 2022-12-25 NOTE — Progress Notes (Signed)
[facility] PROGRESS NOTE  Cheryl Quinn (DOB: January 04, 1933) TGY:[id number] PCP: Lavone Orn, MD  Brief Narrative: Cheryl Quinn is an 89 y.o. female with a history of IPF not on home oxygen, T2DM, HTN, dyslipidemia who was sent to the ED on 12/24/2022 from PCP due to shortness of breath and concern for new atrial fibrillation. She was noted to be borderline hypoxic. In the ED, vitals were stable with NSR and frequent PACs with new (from remote prior) LBBB on ECG. Labs detailed below, mostly stable and normal except mild hypokalemia with potassium of 3.2.  Chest x-ray with ill-defined opacities bilateral bases. Chronic fibrotic lung disease and also noted a 6 mm nodular opacity which is concerning for a possible pulmonary nodule and a follow-up CT chest was recommended.  Cardiology was consulted, suspected multifocal atrial tachycardia precipitated by pulmonary disease, advised against anticoagulation. Overnight cardiac monitoring has not detected atrial fibrillation. Echocardiogram was performed this AM and if stable, cardiology recommends discharge on diltiazem 120mg  daily with outpatient follow up.   Subjective: Has been short of breath with exertion, no chest pain, no current palpitations. Would like to go home today. Feels breathing is much better with supplemental oxygen.   Objective: BP (!) 128/50 (BP Location: Left Arm)   Pulse 68   Temp 98.4 F (36.9 C) (Oral)   Resp 20   Ht 5\' 1"  (1.549 m)   Wt 72.1 kg   SpO2 95%   BMI 30.04 kg/m   Gen: Elderly female in no distress Pulm: Crackles at bases, nonlabored. No wheezes  CV: Regular with premature beats, no MRG, no significant edema. GI: Soft, NT, ND, +BS  Neuro: Alert and oriented. No new focal deficits. Ext: Warm, no deformities. Skin: No rashes, lesions or ulcers on visualized skin   Assessment & Plan: Atrial arrhythmia: MAT favored over AFib per cardiology. TSH wnl. Electrolytes stabilized. - Continue diltiazem,  consolidate to 120mg  SR daily. Avoid BB with pulmonary disease.  - Echocardiogram pending.   Hypokalemia Potassium at 3.2. -Check magnesium -Replace potassium   IPF: Being managed by pulmonary.  Patient was initially started on prednisone and it was discontinued due to elevated blood glucose levels. She was recently given a prescription for pirfenidone which she has not started yet. Not on home oxygen -Continue home albuterol and symbicort - Check ambulatory pulse oximetry prior to discharge.   T2DM: HbA1c 7.5% - Continue home oral agents and PCP follow up  Hypertension She was taken off from home Lasix recently due to softer blood pressure. -Continue to monitor   Hyperlipidemia -Continue home Crestor   Patrecia Pour, MD [facility] [email or web address] 12/25/2022, 1:40 PM

## 2022-12-25 NOTE — ED Notes (Signed)
Echo is at bedside

## 2022-12-26 ENCOUNTER — Other Ambulatory Visit (HOSPITAL_COMMUNITY): Payer: Self-pay

## 2022-12-26 DIAGNOSIS — I519 Heart disease, unspecified: Secondary | ICD-10-CM | POA: Diagnosis not present

## 2022-12-26 DIAGNOSIS — I4719 Other supraventricular tachycardia: Secondary | ICD-10-CM | POA: Diagnosis not present

## 2022-12-26 DIAGNOSIS — I447 Left bundle-branch block, unspecified: Secondary | ICD-10-CM

## 2022-12-26 DIAGNOSIS — I498 Other specified cardiac arrhythmias: Secondary | ICD-10-CM | POA: Diagnosis not present

## 2022-12-26 LAB — BASIC METABOLIC PANEL
Anion gap: 8 (ref 5–15)
BUN: 21 mg/dL (ref 8–23)
CO2: 23 mmol/L (ref 22–32)
Calcium: 9.1 mg/dL (ref 8.9–10.3)
Chloride: 108 mmol/L (ref 98–111)
Creatinine, Ser: 1 mg/dL (ref 0.44–1.00)
GFR, Estimated: 55 mL/min — ABNORMAL LOW (ref >60)
Glucose, Bld: 168 mg/dL — ABNORMAL HIGH (ref 70–99)
Potassium: 3.5 mmol/L (ref 3.5–5.1)
Sodium: 139 mmol/L (ref 135–145)

## 2022-12-26 LAB — GLUCOSE, CAPILLARY
Glucose-Capillary: 162 mg/dL — ABNORMAL HIGH (ref 70–99)
Glucose-Capillary: 194 mg/dL — ABNORMAL HIGH (ref 70–99)

## 2022-12-26 MED ORDER — DAPAGLIFLOZIN PROPANEDIOL 10 MG PO TABS
10.0000 mg | ORAL_TABLET | Freq: Every day | ORAL | 0 refills | Status: DC
  Filled 2022-12-26: qty 30, 30d supply, fill #0

## 2022-12-26 MED ORDER — SPIRONOLACTONE 25 MG PO TABS
12.5000 mg | ORAL_TABLET | Freq: Every day | ORAL | 0 refills | Status: DC
  Filled 2022-12-26: qty 15, 30d supply, fill #0

## 2022-12-26 MED ORDER — LOSARTAN POTASSIUM 25 MG PO TABS
25.0000 mg | ORAL_TABLET | Freq: Every day | ORAL | 0 refills | Status: DC
  Filled 2022-12-26: qty 30, 30d supply, fill #0

## 2022-12-26 NOTE — Discharge Summary (Signed)
Physician Discharge Summary   Patient: Cheryl Quinn MRN: [id number] DOB: 26-Oct-1933  Admit date:     12/24/2022  Discharge date: 12/26/22  Discharge Physician: Patrecia Pour   PCP: Lavone Orn, MD   Recommendations at discharge:  Follow up with pulmonology for IPF. Started supplemental oxygen based on hypoxemia noticed during admission.  Follow up with cardiology. Started losartan, spironolactone for LVEF 35-40%. Admitted for MAT (favored over AFib) though rate has normalized after discontinuation of diltiazem.  Recommend follow up CT chest for 39mm nodular opacity noted on CXR.  Discharge Diagnoses: Principal Problem:   Atrial arrhythmia Active Problems:   ILD (interstitial lung disease) (HCC)   Hypokalemia   Diabetes type 2, controlled ([place])   Hypertension   Hyperlipidemia   Hospital Course: MAÀLLE Quinn is an 89 y.o. female with a history of IPF not on home oxygen, T2DM, HTN, dyslipidemia who was sent to the ED on 12/24/2022 from PCP due to shortness of breath and concern for new atrial fibrillation. She was noted to be borderline hypoxic. In the ED, vitals were stable with NSR and frequent PACs with new (from remote prior) LBBB on ECG. Labs detailed below, mostly stable and normal except mild hypokalemia with potassium of 3.2.  Chest x-ray with ill-defined opacities bilateral bases. Chronic fibrotic lung disease and also noted a 6 mm nodular opacity which is concerning for a possible pulmonary nodule and a follow-up CT chest was recommended.   Cardiology was consulted, suspected multifocal atrial tachycardia precipitated by pulmonary disease, advised against anticoagulation. Overnight cardiac monitoring has not detected atrial fibrillation. Echocardiogram was performed, revealing LVEF 35-40% with dyssynergy consistent with her LBBB. She has no chest pain, so cardiology recommends initiating GDMT as BP tolerates and follow up as an outpatient.   Assessment and Plan: Atrial  arrhythmia: MAT favored over AFib per cardiology. TSH wnl. Electrolytes stabilized. - Stopped CCB with low EF. Avoid BB with pulmonary disease. Rate actually normal, so no agents prescribed at this time.   Acute HFrEF: Newly recognized LVEF 35-40%, large contribution of LBBB/dyssynergy to dysfunction. No anginal complaints currently.  - BP tolerated losartan, spironolactone which will be continued. D/w cardiology who advises against continuing lasix/K supp at this time.  - Monitor weights, volume status as outpatient.    Hypokalemia: Resolved with supplementation.    Acute hypoxic respiratory failure, likely chronic due to IPF: Being managed by pulmonary.  Patient was initially started on prednisone and it was discontinued due to elevated blood glucose levels. She was recently given a prescription for pirfenidone which she has not started yet. - Set up with 2L O2 at home at DC, needs pulm follow up. Encouraged to take medications as prescribed.  -Continue home albuterol and symbicort    T2DM: HbA1c 7.5% - Continue home oral agents and PCP follow up. Consider DC metformin.   Hypertension: She was taken off from home Lasix recently due to softer blood pressure. - BP low-normal after addition of spiro and ARB (tolerating despite cough reaction to ACEi in the past)   Hyperlipidemia -Continue home Crestor  Consultants: Cardiology Procedures performed: Echo  Disposition: Home Diet recommendation:  Cardiac and Carb modified diet DISCHARGE MEDICATION: Allergies as of 12/26/2022       Reactions   Oxycodone Shortness Of Breath, Nausea And Vomiting, Anxiety, Other (See Comments)   Sweating; hullicinations;    Duricef [cefadroxil] Diarrhea   Lipitor [atorvastatin] Other (See Comments)   Muscle weakenss   Lisinopril Cough   Vicodin [  hydrocodone-acetaminophen] Other (See Comments)   hallucinations        Medication List     STOP taking these medications    furosemide 20 MG  tablet Commonly known as: LASIX   Klor-Con M10 10 MEQ tablet Generic drug: potassium chloride       TAKE these medications    Accu-Chek Guide test strip Generic drug: glucose blood USE AS DIRECTED TO CHECK BLOOD SUGARS DAILY 90 DAYS   albuterol 108 (90 Base) MCG/ACT inhaler Commonly known as: VENTOLIN HFA Inhale into the lungs every 6 (six) hours as needed for wheezing or shortness of breath.   ascorbic acid 500 MG tablet Commonly known as: VITAMIN C Take 500 mg by mouth every evening.   BIOFREEZE EX Apply 1 application  topically at bedtime as needed (for nerve pain in feet).   brimonidine 0.1 % Soln Commonly known as: ALPHAGAN P Place 1 drop into both eyes in the morning and at bedtime.   budesonide-formoterol 160-4.5 MCG/ACT inhaler Commonly known as: Symbicort Inhale 2 puffs into the lungs in the morning and at bedtime.   Crestor 10 MG tablet Generic drug: rosuvastatin Take 10 mg by mouth every evening.   dapagliflozin propanediol 10 MG Tabs tablet Commonly known as: Farxiga Take 1 tablet (10 mg total) by mouth daily before breakfast.   diclofenac 1.3 % Ptch Commonly known as: FLECTOR Place 1 patch onto the skin daily as needed (for painful areas).   dorzolamide-timolol 2-0.5 % ophthalmic solution Commonly known as: COSOPT Place 1 drop into both eyes 2 (two) times daily.   famotidine 10 MG tablet Commonly known as: PEPCID Take 10 mg by mouth daily as needed for heartburn.   glimepiride 2 MG tablet Commonly known as: AMARYL Take 1-2 mg by mouth every morning. Take 1 tablet by mouth in the morning and 1/2 tablet with lunch   losartan 25 MG tablet Commonly known as: COZAAR Take 1 tablet (25 mg total) by mouth daily.   Lumigan 0.01 % Soln Generic drug: bimatoprost Place 1 drop into both eyes at bedtime.   metFORMIN 500 MG 24 hr tablet Commonly known as: GLUCOPHAGE-XR Take 500 mg by mouth 2 (two) times daily with a meal.   Pirfenidone 267 MG  Tabs Month 1: Take 1 tab three times daily for 7 days, then 2 tabs three times daily for 7 days, then 3 tabs three times daily thereafter.   Pirfenidone 267 MG Tabs Take 3 tablets (801 mg total) by mouth in the morning, at noon, and at bedtime. Start taking on: January 19, 2023   sertraline 50 MG tablet Commonly known as: ZOLOFT Take 50 mg by mouth daily.   spironolactone 25 MG tablet Commonly known as: ALDACTONE Take 0.5 tablets (12.5 mg total) by mouth daily.   Tylenol 8 Hour 650 MG CR tablet Generic drug: acetaminophen Take 650 mg by mouth daily as needed for pain.   VITAMIN D PO Take 1,000 capsules by mouth every evening.               Durable Medical Equipment  (From admission, onward)           Start     Ordered   12/26/22 1007  DME Oxygen  Once       Question Answer Comment  Length of Need 6 Months   Mode or (Route) Nasal cannula   Liters per Minute 2   Frequency Continuous (stationary and portable oxygen unit needed)   Oxygen delivery system  Gas      12/26/22 1007            Follow-up Information     Lavone Orn, MD. Schedule an appointment as soon as possible for a visit.   Specialty: Internal Medicine Contact information: [address] [place] [address] [phone]         [place], Peter M, MD. Schedule an appointment as soon as possible for a visit.   Specialty: Cardiology Contact information: [address] [place] [address] [phone]                Discharge Exam: Danley Danker Weights   12/24/22 1322  Weight: 72.1 kg  BP (!) 106/52 (BP Location: Left Arm)   Pulse 65   Temp 97.9 F (36.6 C) (Oral)   Resp 15   Ht 5\' 1"  (1.549 m)   Wt 72.1 kg   SpO2 93%   BMI 30.04 kg/m   Well-appearing elderly female in no distress Fine crackles bilaterally, nonlabored completely flat Regular with premature beats, no MRG or pitting edema Alert, oriented, HOH with hearing aids.   Condition  at discharge: stable  The results of significant diagnostics from this hospitalization (including imaging, microbiology, ancillary and laboratory) are listed below for reference.   Imaging Studies: ECHOCARDIOGRAM COMPLETE  Result Date: 12/25/2022    ECHOCARDIOGRAM REPORT   Patient Name:   CATRENA VARI Date of Exam: 12/25/2022 Medical Rec #:  [id number]        Height:       61.0 in Accession #:    [id number]       Weight:       159.0 lb Date of Birth:  09-22-1933       BSA:          1.713 m Patient Age:    89 years         BP:           106/50 mmHg Patient Gender: F                HR:           57 bpm. Exam Location:  Inpatient Procedure: 2D Echo, Cardiac Doppler and Color Doppler Indications:    Atrial fibrillation  History:        Patient has no prior history of Echocardiogram examinations.                 Arrythmias:PAC and LBBB, Signs/Symptoms:Shortness of Breath;                 Risk Factors:Diabetes, Hypertension and Dyslipidemia.  Sonographer:    Eartha Inch Referring Phys: [id number] PETER M [place]  Sonographer Comments: Technically difficult study due to poor echo windows. Image acquisition challenging due to patient body habitus and Image acquisition challenging due to respiratory motion. IMPRESSIONS  1. Left ventricular ejection fraction, by estimation, is 35 to 40%. The left ventricle has moderately decreased function. The left ventricle has no regional wall motion abnormalities. Left ventricular diastolic parameters are consistent with Grade I diastolic dysfunction (impaired relaxation). Septal dyssynergy due to LBBB.  2. Right ventricular systolic function is normal. The right ventricular size is normal.  3. Left atrial size was moderately dilated.  4. The mitral valve is normal in structure. Trivial mitral valve regurgitation. No evidence of mitral stenosis.  5. The aortic valve is calcified. There is mild calcification of the aortic valve. Aortic valve regurgitation is mild. No aortic stenosis  is present.  6. The inferior vena cava is normal in size with greater than 50% respiratory variability, suggesting right atrial pressure of 3 mmHg. FINDINGS  Left Ventricle: Left ventricular ejection fraction, by estimation, is 35 to 40%. The left ventricle has moderately decreased function. The left ventricle has no regional wall motion abnormalities. The left ventricular internal cavity size was normal in size. There is no left ventricular hypertrophy. Abnormal (paradoxical) septal motion, consistent with left bundle branch block. Left ventricular diastolic parameters are consistent with Grade I diastolic dysfunction (impaired relaxation). Right Ventricle: The right ventricular size is normal. No increase in right ventricular wall thickness. Right ventricular systolic function is normal. Left Atrium: Left atrial size was moderately dilated. Right Atrium: Right atrial size was normal in size. Pericardium: There is no evidence of pericardial effusion. Mitral Valve: The mitral valve is normal in structure. Trivial mitral valve regurgitation. No evidence of mitral valve stenosis. MV peak gradient, 3.6 mmHg. The mean mitral valve gradient is 1.0 mmHg. Tricuspid Valve: The tricuspid valve is normal in structure. Tricuspid valve regurgitation is not demonstrated. No evidence of tricuspid stenosis. Aortic Valve: The aortic valve is calcified. There is mild calcification of the aortic valve. Aortic valve regurgitation is mild. Aortic regurgitation PHT measures 824 msec. No aortic stenosis is present. Pulmonic Valve: The pulmonic valve was normal in structure. Pulmonic valve regurgitation is trivial. No evidence of pulmonic stenosis. Aorta: The aortic root is normal in size and structure. Venous: The inferior vena cava is normal in size with greater than 50% respiratory variability, suggesting right atrial pressure of 3 mmHg. IAS/Shunts: No atrial level shunt detected by color flow Doppler.  LEFT VENTRICLE PLAX 2D LVIDd:          5.10 cm     Diastology LVIDs:         4.20 cm     LV e' medial:    3.33 cm/s LV PW:         0.80 cm     LV E/e' medial:  25.8 LV IVS:        0.80 cm     LV e' lateral:   2.98 cm/s LVOT diam:     2.20 cm     LV E/e' lateral: 28.8 LV SV:         84 LV SV Index:   49 LVOT Area:     3.80 cm  LV Volumes (MOD) LV vol d, MOD A2C: 59.4 ml LV vol d, MOD A4C: 96.0 ml LV vol s, MOD A2C: 23.7 ml LV vol s, MOD A4C: 44.4 ml LV SV MOD A2C:     35.7 ml LV SV MOD A4C:     96.0 ml LV SV MOD BP:      48.0 ml RIGHT VENTRICLE             IVC RV S prime:     11.80 cm/s  IVC diam: 1.90 cm TAPSE (M-mode): 2.2 cm LEFT ATRIUM             Index        RIGHT ATRIUM          Index LA diam:        3.80 cm 2.22 cm/m   RA Area:     9.01 cm LA Vol (A2C):   68.1 ml 39.75 ml/m  RA Volume:   15.70 ml 9.16 ml/m LA Vol (A4C):   61.3 ml 35.78 ml/m LA Biplane Vol: 66.1 ml 38.58 ml/m  AORTIC VALVE LVOT Vmax:         98.60 cm/s LVOT Vmean:        72.000 cm/s LVOT VTI:          0.222 m AI PHT:            824 msec AR Vena Contracta: 0.10 cm  AORTA Ao Root diam: 2.80 cm Ao Asc diam:  3.20 cm MITRAL VALVE               TRICUSPID VALVE MV Area (PHT): 3.10 cm    TR Peak grad:   15.2 mmHg MV Area VTI:   1.98 cm    TR Vmax:        195.00 cm/s MV Peak grad:  3.6 mmHg MV Mean grad:  1.0 mmHg    SHUNTS MV Vmax:       0.95 m/s    Systemic VTI:  0.22 m MV Vmean:      52.7 cm/s   Systemic Diam: 2.20 cm MV Decel Time: 245 msec MV E velocity: 85.95 cm/s MV A velocity: 96.70 cm/s MV E/A ratio:  0.89 Glori Bickers MD Electronically signed by Glori Bickers MD Signature Date/Time: 12/25/2022/3:01:39 PM    Final    DG Chest 2 View  Result Date: 12/24/2022 CLINICAL DATA:  Provided history: Atrial fibrillation. Additional history provided: Shortness of breath for two months. Left-sided chest pain. History of hypertension and diabetes. Former smoker. EXAM: CHEST - 2 VIEW COMPARISON:  Prior chest radiographs 06/05/2022 and earlier. Chest CT 08/26/2022.  FINDINGS: Heart size within normal limits. Known fibrotic lung disease, more fully characterized on the prior chest CT of 08/26/2022. A 6 mm nodular opacity projects in the region of the lateral right lung base on the AP radiograph (see annotation on image). Ill-defined opacities within bilateral lung bases, which may reflect atelectasis or pneumonia. No evidence of pleural effusion or pneumothorax. No acute bony abnormality identified. Surgical clips within the upper abdomen. IMPRESSION: 1. Ill-defined opacities within the bilateral lung bases, which may reflect atelectasis or pneumonia. 2. A 6 mm nodular opacity projects in the region of the lateral right lung base on the AP radiograph. This is concerning for a possible pulmonary nodule, and a chest CT is recommended for further evaluation. 3. Chronic fibrotic lung disease, more fully characterized on the prior chest CT of 08/26/2022. Electronically Signed   By: Kellie Simmering D.O.   On: 12/24/2022 15:26    Microbiology: No results found for this or any previous visit.  Labs: CBC: Recent Labs  Lab 12/24/22 1331 12/24/22 1941 12/25/22 0459  WBC 9.1 8.8 7.8  HGB 12.5 12.0 10.5*  HCT 37.3 34.7* 31.2*  MCV 93.3 92.5 92.9  PLT 377 360 629   Basic Metabolic Panel: Recent Labs  Lab 12/24/22 1331 12/24/22 1941 12/25/22 0459 12/26/22 0215  NA 137  --  138 139  K 3.2*  --  3.5 3.5  CL 104  --  106 108  CO2 23  --  23 23  GLUCOSE 164*  --  163* 168*  BUN 20  --  21 21  CREATININE 1.03* 1.03* 1.00 1.00  CALCIUM 9.2  --  8.8* 9.1  MG  --  1.8  --   --    Liver Function Tests: Recent Labs  Lab 12/23/22 1357  AST 17  ALT 19  ALKPHOS 50  BILITOT 0.4  PROT 7.4  ALBUMIN 3.8   CBG: Recent Labs  Lab 12/25/22 0740 12/25/22  1151 12/25/22 1700 12/25/22 2112 12/26/22 0557  GLUCAP 158* 144* 94 171* 162*    Discharge time spent: greater than 30 minutes.  Signed: Patrecia Pour, MD [facility] 12/26/2022

## 2022-12-26 NOTE — Progress Notes (Signed)
Heart Failure Navigator Progress Note  Assessed for Heart & Vascular TOC clinic readiness.  Patient EF 35-40%, has follow up with [facility] on 01/15/23.   Navigator available for reassessment of patient.   Earnestine Leys, BSN, Clinical cytogeneticist Only

## 2022-12-26 NOTE — Progress Notes (Addendum)
Rounding Note    Patient Name: Cheryl Quinn Date of Encounter: 12/26/2022  [facility] Cardiologist: None New  Subjective   Feels OK this am. Has her chronic SOB. No chest pain or palpitation. Oxygen levels were low at rest yesterday so remains on oxygen.   Inpatient Medications    Scheduled Meds:  enoxaparin (LOVENOX) injection  40 mg Subcutaneous Q24H   famotidine  10 mg Oral BID   fluticasone furoate-vilanterol  1 puff Inhalation Daily   insulin aspart  0-5 Units Subcutaneous QHS   insulin aspart  0-9 Units Subcutaneous TID WC   losartan  25 mg Oral Daily   rosuvastatin  10 mg Oral Daily   sertraline  50 mg Oral Daily   spironolactone  12.5 mg Oral Daily   Continuous Infusions:  PRN Meds: acetaminophen **OR** acetaminophen, albuterol, ondansetron **OR** ondansetron (ZOFRAN) IV, polyethylene glycol   Vital Signs    Vitals:   12/26/22 0311 12/26/22 0600 12/26/22 0800 12/26/22 0904  BP: (!) 125/59  (!) 106/52   Pulse: (!) 56 (!) 56 65   Resp: 20 19 15    Temp: 97.7 F (36.5 C)  97.9 F (36.6 C)   TempSrc: Oral  Oral   SpO2: 97% 95% 95% 93%  Weight:      Height:       No intake or output data in the 24 hours ending 12/26/22 0931    12/24/2022    1:22 PM 12/05/2022    2:42 PM 10/28/2022    2:05 PM  Last 3 Weights  Weight (lbs) 159 lb 165 lb 166 lb  Weight (kg) 72.122 kg 74.844 kg 75.297 kg      Telemetry    NSR with PACs - Personally Reviewed  ECG    NSR with PACs , LBBB - Personally Reviewed  Physical Exam   GEN: No acute distress.  Lying flat Neck: No JVD Cardiac: RRR, no murmurs, rubs, or gallops.  Respiratory: bibasilar dry crackles GI: Soft, nontender, non-distended  MS: No edema; No deformity. Neuro:  Nonfocal  Psych: Normal affect   Labs    High Sensitivity Troponin:   Recent Labs  Lab 12/24/22 1331 12/24/22 1610  TROPONINIHS 20* 18*      Chemistry Recent Labs  Lab 12/23/22 1357 12/24/22 1331 12/24/22 1331  12/24/22 1941 12/25/22 0459 12/26/22 0215  NA  --  137  --   --  138 139  K  --  3.2*  --   --  3.5 3.5  CL  --  104  --   --  106 108  CO2  --  23  --   --  23 23  GLUCOSE  --  164*  --   --  163* 168*  BUN  --  20  --   --  21 21  CREATININE  --  1.03*   < > 1.03* 1.00 1.00  CALCIUM  --  9.2  --   --  8.8* 9.1  MG  --   --   --  1.8  --   --   PROT 7.4  --   --   --   --   --   ALBUMIN 3.8  --   --   --   --   --   AST 17  --   --   --   --   --   ALT 19  --   --   --   --   --  ALKPHOS 50  --   --   --   --   --   BILITOT 0.4  --   --   --   --   --   GFRNONAA  --  53*   < > 53* 55* 55*  ANIONGAP  --  10  --   --  9 8   < > = values in this interval not displayed.     Lipids No results for input(s): "CHOL", "TRIG", "HDL", "LABVLDL", "LDLCALC", "CHOLHDL" in the last 168 hours.  Hematology Recent Labs  Lab 12/24/22 1331 12/24/22 1941 12/25/22 0459  WBC 9.1 8.8 7.8  RBC 4.00 3.75* 3.36*  HGB 12.5 12.0 10.5*  HCT 37.3 34.7* 31.2*  MCV 93.3 92.5 92.9  MCH 31.3 32.0 31.3  MCHC 33.5 34.6 33.7  RDW 13.9 14.3 14.2  PLT 377 360 310    Thyroid  Recent Labs  Lab 12/24/22 1802  TSH 1.611     BNP Recent Labs  Lab 12/24/22 1331  BNP 47.4     DDimer No results for input(s): "DDIMER" in the last 168 hours.   Radiology    ECHOCARDIOGRAM COMPLETE  Result Date: 12/25/2022    ECHOCARDIOGRAM REPORT   Patient Name:   Cheryl Quinn Date of Exam: 12/25/2022 Medical Rec #:  [id number]        Height:       61.0 in Accession #:    [id number]       Weight:       159.0 lb Date of Birth:  [birth year]       BSA:          1.713 m Patient Age:    89 years         BP:           106/50 mmHg Patient Gender: F                HR:           57 bpm. Exam Location:  Inpatient Procedure: 2D Echo, Cardiac Doppler and Color Doppler Indications:    Atrial fibrillation  History:        Patient has no prior history of Echocardiogram examinations.                 Arrythmias:PAC and LBBB,  Signs/Symptoms:Shortness of Breath;                 Risk Factors:Diabetes, Hypertension and Dyslipidemia.  Sonographer:    Eartha Inch Referring Phys: [id number] Luz Mares M [place]  Sonographer Comments: Technically difficult study due to poor echo windows. Image acquisition challenging due to patient body habitus and Image acquisition challenging due to respiratory motion. IMPRESSIONS  1. Left ventricular ejection fraction, by estimation, is 35 to 40%. The left ventricle has moderately decreased function. The left ventricle has no regional wall motion abnormalities. Left ventricular diastolic parameters are consistent with Grade I diastolic dysfunction (impaired relaxation). Septal dyssynergy due to LBBB.  2. Right ventricular systolic function is normal. The right ventricular size is normal.  3. Left atrial size was moderately dilated.  4. The mitral valve is normal in structure. Trivial mitral valve regurgitation. No evidence of mitral stenosis.  5. The aortic valve is calcified. There is mild calcification of the aortic valve. Aortic valve regurgitation is mild. No aortic stenosis is present.  6. The inferior vena cava is normal in size with greater than 50% respiratory variability, suggesting right atrial pressure of 3  mmHg. FINDINGS  Left Ventricle: Left ventricular ejection fraction, by estimation, is 35 to 40%. The left ventricle has moderately decreased function. The left ventricle has no regional wall motion abnormalities. The left ventricular internal cavity size was normal in size. There is no left ventricular hypertrophy. Abnormal (paradoxical) septal motion, consistent with left bundle branch block. Left ventricular diastolic parameters are consistent with Grade I diastolic dysfunction (impaired relaxation). Right Ventricle: The right ventricular size is normal. No increase in right ventricular wall thickness. Right ventricular systolic function is normal. Left Atrium: Left atrial size was moderately  dilated. Right Atrium: Right atrial size was normal in size. Pericardium: There is no evidence of pericardial effusion. Mitral Valve: The mitral valve is normal in structure. Trivial mitral valve regurgitation. No evidence of mitral valve stenosis. MV peak gradient, 3.6 mmHg. The mean mitral valve gradient is 1.0 mmHg. Tricuspid Valve: The tricuspid valve is normal in structure. Tricuspid valve regurgitation is not demonstrated. No evidence of tricuspid stenosis. Aortic Valve: The aortic valve is calcified. There is mild calcification of the aortic valve. Aortic valve regurgitation is mild. Aortic regurgitation PHT measures 824 msec. No aortic stenosis is present. Pulmonic Valve: The pulmonic valve was normal in structure. Pulmonic valve regurgitation is trivial. No evidence of pulmonic stenosis. Aorta: The aortic root is normal in size and structure. Venous: The inferior vena cava is normal in size with greater than 50% respiratory variability, suggesting right atrial pressure of 3 mmHg. IAS/Shunts: No atrial level shunt detected by color flow Doppler.  LEFT VENTRICLE PLAX 2D LVIDd:         5.10 cm     Diastology LVIDs:         4.20 cm     LV e' medial:    3.33 cm/s LV PW:         0.80 cm     LV E/e' medial:  25.8 LV IVS:        0.80 cm     LV e' lateral:   2.98 cm/s LVOT diam:     2.20 cm     LV E/e' lateral: 28.8 LV SV:         84 LV SV Index:   49 LVOT Area:     3.80 cm  LV Volumes (MOD) LV vol d, MOD A2C: 59.4 ml LV vol d, MOD A4C: 96.0 ml LV vol s, MOD A2C: 23.7 ml LV vol s, MOD A4C: 44.4 ml LV SV MOD A2C:     35.7 ml LV SV MOD A4C:     96.0 ml LV SV MOD BP:      48.0 ml RIGHT VENTRICLE             IVC RV S prime:     11.80 cm/s  IVC diam: 1.90 cm TAPSE (M-mode): 2.2 cm LEFT ATRIUM             Index        RIGHT ATRIUM          Index LA diam:        3.80 cm 2.22 cm/m   RA Area:     9.01 cm LA Vol (A2C):   68.1 ml 39.75 ml/m  RA Volume:   15.70 ml 9.16 ml/m LA Vol (A4C):   61.3 ml 35.78 ml/m LA Biplane  Vol: 66.1 ml 38.58 ml/m  AORTIC VALVE LVOT Vmax:         98.60 cm/s LVOT Vmean:  72.000 cm/s LVOT VTI:          0.222 m AI PHT:            824 msec AR Vena Contracta: 0.10 cm  AORTA Ao Root diam: 2.80 cm Ao Asc diam:  3.20 cm MITRAL VALVE               TRICUSPID VALVE MV Area (PHT): 3.10 cm    TR Peak grad:   15.2 mmHg MV Area VTI:   1.98 cm    TR Vmax:        195.00 cm/s MV Peak grad:  3.6 mmHg MV Mean grad:  1.0 mmHg    SHUNTS MV Vmax:       0.95 m/s    Systemic VTI:  0.22 m MV Vmean:      52.7 cm/s   Systemic Diam: 2.20 cm MV Decel Time: 245 msec MV E velocity: 85.95 cm/s MV A velocity: 96.70 cm/s MV E/A ratio:  0.89 Glori Bickers MD Electronically signed by Glori Bickers MD Signature Date/Time: 12/25/2022/3:01:39 PM    Final    DG Chest 2 View  Result Date: 12/24/2022 CLINICAL DATA:  Provided history: Atrial fibrillation. Additional history provided: Shortness of breath for two months. Left-sided chest pain. History of hypertension and diabetes. Former smoker. EXAM: CHEST - 2 VIEW COMPARISON:  Prior chest radiographs 06/05/2022 and earlier. Chest CT 08/26/2022. FINDINGS: Heart size within normal limits. Known fibrotic lung disease, more fully characterized on the prior chest CT of 08/26/2022. A 6 mm nodular opacity projects in the region of the lateral right lung base on the AP radiograph (see annotation on image). Ill-defined opacities within bilateral lung bases, which may reflect atelectasis or pneumonia. No evidence of pleural effusion or pneumothorax. No acute bony abnormality identified. Surgical clips within the upper abdomen. IMPRESSION: 1. Ill-defined opacities within the bilateral lung bases, which may reflect atelectasis or pneumonia. 2. A 6 mm nodular opacity projects in the region of the lateral right lung base on the AP radiograph. This is concerning for a possible pulmonary nodule, and a chest CT is recommended for further evaluation. 3. Chronic fibrotic lung disease, more fully  characterized on the prior chest CT of 08/26/2022. Electronically Signed   By: Kellie Simmering D.O.   On: 12/24/2022 15:26    Cardiac Studies   Echo IMPRESSIONS     1. Left ventricular ejection fraction, by estimation, is 35 to 40%. The  left ventricle has moderately decreased function. The left ventricle has  no regional wall motion abnormalities. Left ventricular diastolic  parameters are consistent with Grade I  diastolic dysfunction (impaired relaxation). Septal dyssynergy due to  LBBB.   2. Right ventricular systolic function is normal. The right ventricular  size is normal.   3. Left atrial size was moderately dilated.   4. The mitral valve is normal in structure. Trivial mitral valve  regurgitation. No evidence of mitral stenosis.   5. The aortic valve is calcified. There is mild calcification of the  aortic valve. Aortic valve regurgitation is mild. No aortic stenosis is  present.   6. The inferior vena cava is normal in size with greater than 50%  respiratory variability, suggesting right atrial pressure of 3 mmHg.    Patient Profile     89 y.o. female  with a hx of IPF who is being seen 12/24/2022 for the evaluation of atrial arrhythmia  at the request of Dr Vanita Panda.   Assessment & Plan  Atrial arrhythmia. Brief runs of fast irregular rhythm. Mostly in NSR with PACs. This appears to be MAT given underlying pulmonary disease with IPF. I don't think this is Afib.  SInce her hospitalization HR has been normal without tachycardia.  Avoid beta blocker given lung disease.  TSH normal.  Avoid diltiazem with LV dysfunction.  She is asymptomatic.  IPF- per pulmonary. Recently prescribed Esbriet but she is reluctant to start given potential side effects.  DM type 2 HLD LBBB new since  2016.  LV dysfunction. EF 40%. There is dyssynergy related to LBBB. She is not volume overloaded and BNP is normal. I still think all her SOB is lung related. We do need to initiate guideline directed  medical therapy. On losartan and aldactone now. Would start [place] or Jardiance depending on insurance coverage. Can titrate meds further as outpatient. She is stable for DC today. Low suspicion for ischemic cause of LV dysfunction and she has no angina. Could consider evaluation as outpatient with PET CT stress     For questions or updates, please contact [place] Please consult [email or web address] for contact info under        Signed, Tan Clopper [place], MD  12/26/2022, 9:31 AM

## 2022-12-26 NOTE — Progress Notes (Signed)
Nurse requested Mobility Specialist to perform oxygen saturation test with pt which includes removing pt from oxygen both at rest and while ambulating.  Below are the results from that testing.     Patient Saturations on Room Air at Rest = spO2 87%  Patient Saturations on Room Air while Ambulating =  n/a d/t low saturations at rest   Patient Saturations on 2 Liters of oxygen while Ambulating = sp02 92%  At end of testing pt left in room on 2  Liters of oxygen.  Reported results to nurse.

## 2022-12-26 NOTE — Progress Notes (Signed)
Pt discharged per MD order. IV and tele removed. Discharge instructions reviewed with patient and husband. Questions answered to satisfaction, patient and family verbalized understanding. Pt to transport home in private vehicle.   Cheryl Quinn M

## 2022-12-26 NOTE — Care Management Obs Status (Signed)
[place] NOTIFICATION   Patient Details  Name: Cheryl Quinn MRN: [id number] Date of Birth: 1933-07-15   Medicare Observation Status Notification Given:  Yes    Verdell Carmine, RN 12/26/2022, 1:36 PM

## 2022-12-26 NOTE — Progress Notes (Signed)
Mobility Specialist Progress Note:   12/26/22 1200  Mobility  Activity Ambulated with assistance in hallway  Level of Assistance Standby assist, set-up cues, supervision of patient - no hands on  Assistive Device Front wheel walker  Distance Ambulated (ft) 60 ft  Activity Response Tolerated well  $Mobility charge 1 Mobility   Pt in bed willing to participate in mobility. No complaints of pain. Left in bed with call bell in reach and all needs met.   Gareth Eagle Donnelle Rubey Mobility Specialist Please contact via [employer] or  Rehab Office at [phone]

## 2022-12-26 NOTE — Progress Notes (Signed)
SATURATION QUALIFICATIONS: (This note is used to comply with regulatory documentation for home oxygen)  Patient Saturations on Room Air at Rest = 87%  Patient Saturations on Room Air while Ambulating = n/a%  Patient Saturations on 2 Liters of oxygen while Ambulating = 92%  Please briefly explain why patient needs home oxygen:

## 2022-12-31 ENCOUNTER — Encounter: Payer: Self-pay | Admitting: Pulmonary Disease

## 2022-12-31 ENCOUNTER — Ambulatory Visit: Payer: Medicare PPO | Admitting: Pulmonary Disease

## 2022-12-31 VITALS — BP 112/58 | HR 80 | Temp 97.3°F | Ht 61.0 in | Wt 161.4 lb

## 2022-12-31 DIAGNOSIS — Z5181 Encounter for therapeutic drug level monitoring: Secondary | ICD-10-CM | POA: Diagnosis not present

## 2022-12-31 DIAGNOSIS — J849 Interstitial pulmonary disease, unspecified: Secondary | ICD-10-CM | POA: Diagnosis not present

## 2022-12-31 NOTE — Progress Notes (Signed)
Cheryl Quinn    [id number]    Sep 01, 1933  Primary Care Physician:Raju, Siri Cole, MD  Referring Physician: Lavone Orn, MD [address] [place],  [place] [address]  Chief complaint: Follow-up for interstitial lung disease  HPI: 89 y.o. who  has a past medical history of Arthritis, Asthma, Complete rupture of left rotator cuff (09/28/2015), Diabetes mellitus without complication ([place]), GERD (gastroesophageal reflux disease), Glaucoma, Hyperlipidemia, Hypertension, PONV (postoperative nausea and vomiting), and Rotator cuff tear.   Referred here for evaluation of interstitial lung disease.  She had cough and shortness of breath which is worsened after she developed pneumonia seen outpatient earlier this year.  She had several chest x-rays and at least 2 rounds of antibiotics with some improvement but continues to have mild persistent symptoms.  She has history of recurrent bronchitis and asthma but not on controller medication.  She is using albuterol as needed.  History also notable for seasonal allergies and occasional GERD symptoms  She has seen Dr. Benjamine Mola from Rheumatology in November 2023 for elevated rheumatoid factor.  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  No mold, hot tub, Jacuzzi. ILD questionnaire 07/21/2022-negative except for above Smoking history: Smoked 2 to 3 cigarettes a day for 45 years.  Quit in 2000 Travel history: No significant travel history Relevant family history: Sister has COPD  Interim history: She was given a trial of prednisone in December 2023 for presumptive diagnosis of HP as she had a exposure to down She feels that the prednisone actually make her breathing worse and sugars are elevated  Reviewed her case at multidisciplinary conference in January 2024 and findings were felt to be more consistent with  IPF  In December 2023 after informed decision making we have decided to try Esbriet but after review of side effects they are having second thoughts on this medication.  Hospitalized in February 2023 with multifocal atrial tachycardia, acute hypoxic respiratory failure secondary to ILD and has been started on supplemental oxygen at 2 L.  Outpatient Encounter Medications as of 12/31/2022  Medication Sig   ACCU-CHEK GUIDE test strip USE AS DIRECTED TO CHECK BLOOD SUGARS DAILY 90 DAYS   acetaminophen (TYLENOL 8 HOUR) 650 MG CR tablet Take 650 mg by mouth daily as needed for pain.   albuterol (PROVENTIL HFA;VENTOLIN HFA) 108 (90 BASE) MCG/ACT inhaler Inhale into the lungs every 6 (six) hours as needed for wheezing or shortness of breath.   ascorbic acid (VITAMIN C) 500 MG tablet Take 500 mg by mouth every evening.   brimonidine (ALPHAGAN P) 0.1 % SOLN Place 1 drop into both eyes in the morning and at bedtime.   budesonide-formoterol (SYMBICORT) 160-4.5 MCG/ACT inhaler Inhale 2 puffs into the lungs in the morning and at bedtime.   CRESTOR 10 MG tablet Take 10 mg by mouth every evening.   dapagliflozin propanediol (FARXIGA) 10 MG TABS tablet Take 1 tablet (10 mg total) by mouth daily before breakfast.   diclofenac (FLECTOR) 1.3 % PTCH Place 1 patch onto the skin daily as needed (for painful areas).   dorzolamide-timolol (COSOPT) 22.3-6.8 MG/ML ophthalmic solution Place 1 drop into both eyes 2 (two) times daily.   famotidine (PEPCID) 10 MG tablet Take 10 mg by mouth daily as needed for heartburn.   glimepiride (AMARYL) 2 MG tablet Take 1-2 mg by mouth every  morning. Take 1 tablet by mouth in the morning and 1/2 tablet with lunch   losartan (COZAAR) 25 MG tablet Take 1 tablet (25 mg total) by mouth daily.   LUMIGAN 0.01 % SOLN Place 1 drop into both eyes at bedtime.   Menthol, Topical Analgesic, (BIOFREEZE EX) Apply 1 application  topically at bedtime as needed (for nerve pain in feet).   metFORMIN  (GLUCOPHAGE-XR) 500 MG 24 hr tablet Take 500 mg by mouth 2 (two) times daily with a meal.   [START ON 01/19/2023] Pirfenidone 267 MG TABS Take 3 tablets (801 mg total) by mouth in the morning, at noon, and at bedtime.   sertraline (ZOLOFT) 50 MG tablet Take 50 mg by mouth daily.   spironolactone (ALDACTONE) 25 MG tablet Take 1/2  tablet (12.5 mg total) by mouth daily.   VITAMIN D PO Take 1,000 capsules by mouth every evening.   Pirfenidone 267 MG TABS Month 1: Take 1 tab three times daily for 7 days, then 2 tabs three times daily for 7 days, then 3 tabs three times daily thereafter. (Patient not taking: Reported on 12/31/2022)   No facility-administered encounter medications on file as of 12/31/2022.   Physical Exam: Blood pressure (!) 112/58, pulse 80, temperature (!) 97.3 F (36.3 C), temperature source Oral, height 5\' 1"  (1.549 m), weight 161 lb 6.4 oz (73.2 kg), SpO2 99 %. Gen:      No acute distress HEENT:  EOMI, sclera anicteric Neck:     No masses; no thyromegaly Lungs:    Bibasal crackles CV:         Regular rate and rhythm; no murmurs Abd:      + bowel sounds; soft, non-tender; no palpable masses, no distension Ext:    No edema; adequate peripheral perfusion Skin:      Warm and dry; no rash Neuro: alert and oriented x 3 Psych: normal mood and affect   Data Reviewed: Imaging: Chest x-ray 04/15/2022-diffuse interstitial thickening. Chest x-ray 05/06/2022-bibasal opacities Chest x-ray 06/05/2022-chronic interstitial opacities with bibasal atelectasis High-resolution CT chest 09/02/2022-mild pulmonary fibrosis with basal gradient, septal thickening, traction bronchiectasis and bronchiolectasis.  Probable UIP.  Kidney lesions noted. I reviewed the images personally  PFTs: 09/15/2022 FVC 2.08 [111%], FEV1 2.04 [136%], F/F 90, TLC 4.37 [93%], DLCO 14.29 [84%] Normal test, Airtrapping and bronchodilator response suggests minimal airway obstruction  Labs: CTD serologies 07/21/2022-ANA 1:  180, cytoplasmic.  Rheumatoid factor 6.6  Assessment:  Evaluation for interstitial lung disease Reviewed CT which shows mild fibrosis and probable UIP pattern.  She does have arthritis symptoms with elevated ANA and rheumatoid factor.  Noted to have ongoing exposure to down feather  We reviewed findings today and possibilities include chronic HP or IPF.  No evidence of rheumatoid arthritis on rheumatology evaluation.  I do not believe she is a good candidate for bronchoscopy or lung biopsy given her age  She has gotten rid of down pillows in December 2023.  Has not responded well to steroids which made her symptoms worse. After discussion at ILD conference it was felt that the presentation is more consistent with IPF.  Now off steroids.  We had prescribed Esbriet but after review of side effects he would like to avoid and focus on symptom management and quality of life.  I agree with this decision as she has a tendency to have side effects with medications Continue supplemental oxygen.  Qualify for portable concentrator.   Kidney lesions Noted on recent CT chest.  She  has been referred to urology but has not heard from them.  She and her husband feel that they would not want surgery or chemotherapy even if malignancy was diagnosed and referral was canceled.  Asthma PFTs reviewed with trapping and bronchodilator response suggesting airway disease.  She also has trapping noted on CT scan Continue Symbicort  Plan/Recommendations: Cancel Esbriet prescription Continue Symbicort Qualify for portable concentrator Follow-up in 6 months.  Marshell Garfinkel MD [facility] 12/31/2022, 11:12 AM  CC: Lavone Orn, MD

## 2022-12-31 NOTE — Patient Instructions (Signed)
Will check your oxygen levels on exertion and see if you qualify for portable concentrator Will discontinue the Esbriet prescription Follow-up in 6 months

## 2023-01-06 ENCOUNTER — Telehealth: Payer: Self-pay | Admitting: Pulmonary Disease

## 2023-01-06 NOTE — Progress Notes (Signed)
   Interstitial Lung Disease Multidisciplinary Conference   Cheryl Quinn    MRN [id number]    DOB 1933/09/27  Primary Care Physician:Raju, Siri Cole, MD  Referring Physician: Marshell Garfinkel MD  Time of Conference: 7.30am- 8.30am Date of conference: 12/02/2022 Location of Conference: -  Virtual  Participating Pulmonary: Dr. Brand Males, MD,  Dr Marshell Garfinkel, MD Pathology: Radiology: Dr Yetta Glassman Others:   Brief History:  Past medical history of Arthritis, Asthma.  Eval for interstitial lung disease on CT scan.  Borderline elevated rheumatoid factor but no evidence of rheumatoid arthritis on rheumatology evaluation. Exposures positive for down pillows. Please review scan and determine pattern and course of treatment.  She is not a great candidate for bronchoscopy or biopsy.   PFT    Latest Ref Rng & Units 07/21/2022   10:06 AM  PFT Results  FVC-Pre L 1.99   FVC-Predicted Pre % 97   FVC-Post L 2.28   FVC-Predicted Post % 111   Pre FEV1/FVC % % 88   Post FEV1/FCV % % 90   FEV1-Pre L 1.74   FEV1-Predicted Pre % 116   FEV1-Post L 2.04   DLCO uncorrected ml/min/mmHg 14.29   DLCO UNC% % 84   DLCO corrected ml/min/mmHg 14.29   DLCO COR %Predicted % 84   DLVA Predicted % 92   TLC L 4.37   TLC % Predicted % 93   RV % Predicted % 56       MDD discussion of CT scan   HRCT: 08/26/2022  Patient has pulmonary fibrosis with basal gradient, septal thickening, groundglass, traction bronchiectasis.  Lobular air trapping noted. Probable UIP pattern per ATS criteria   MDD Impression/Recs:  CT scan shows pulmonary fibrosis in probable UIP pattern.  Hypersensitivity pneumonitis is felt to be less likely though she has done exposure.  Findings consistent with IPF.  Time Spent in preparation and discussion:  > 30 min  SIGNATURE   Hurbert Duran MD [facility] See Amion for pager  If no response to pager , please call [phone]  until 7pm After 7:00 pm call Elink  [phone] 01/06/2023, 8:49 AM

## 2023-01-06 NOTE — Telephone Encounter (Signed)
Received fax from [facility].  POC order was placed 12/31/22 by Dr. Vaughan Browner for POC 5 L [place]. Request completed and faxed to [phone] attention Sonny Lalani. Confirmation received.  Nothing further at this time.

## 2023-01-13 ENCOUNTER — Other Ambulatory Visit (HOSPITAL_COMMUNITY): Payer: Self-pay

## 2023-01-15 ENCOUNTER — Encounter: Payer: Self-pay | Admitting: Physician Assistant

## 2023-01-15 ENCOUNTER — Ambulatory Visit: Payer: Medicare PPO | Attending: Physician Assistant | Admitting: Physician Assistant

## 2023-01-15 VITALS — BP 112/52 | HR 58 | Ht 61.0 in | Wt 164.0 lb

## 2023-01-15 DIAGNOSIS — I519 Heart disease, unspecified: Secondary | ICD-10-CM | POA: Diagnosis not present

## 2023-01-15 DIAGNOSIS — R072 Precordial pain: Secondary | ICD-10-CM | POA: Diagnosis not present

## 2023-01-15 DIAGNOSIS — M25473 Effusion, unspecified ankle: Secondary | ICD-10-CM

## 2023-01-15 DIAGNOSIS — J8410 Pulmonary fibrosis, unspecified: Secondary | ICD-10-CM

## 2023-01-15 DIAGNOSIS — Z79899 Other long term (current) drug therapy: Secondary | ICD-10-CM

## 2023-01-15 DIAGNOSIS — E785 Hyperlipidemia, unspecified: Secondary | ICD-10-CM

## 2023-01-15 DIAGNOSIS — E119 Type 2 diabetes mellitus without complications: Secondary | ICD-10-CM

## 2023-01-15 DIAGNOSIS — I10 Essential (primary) hypertension: Secondary | ICD-10-CM

## 2023-01-15 MED ORDER — FUROSEMIDE 20 MG PO TABS: 20.0000 mg | ORAL_TABLET | Freq: Every day | ORAL | 3 refills | Status: DC

## 2023-01-15 NOTE — Progress Notes (Addendum)
Cardiology Office Note:    Date:  01/16/2023   ID:  Cheryl Quinn, DOB Feb 24, 1933, MRN [id number]  PCP:  Charlane Ferretti, MD   [facility] Cardiologist:  Peter Martinique, MD { Pulmonologist: Dr. Vaughan Browner  Referring MD: Lavone Orn, MD   Chief Complaint  Patient presents with   Follow-up   Edema    Feet and ankles.    History of Present Illness:    Cheryl Quinn is a 89 y.o. female with a hx of hypertension, hyperlipidemia, DM2 and history of pulmonary fibrosis.  Patient was recently admitted in late January 2024 with shortness of breath.  She was placed on supplemental oxygen due to hypoxia.  Chest x-ray showed ill-defined opacity within the bilateral lung bases which may reflect atelectasis or pneumonia, 6 mm nodular opacity in the right lung base.  Cardiology service consulted for tachycardia.  Telemetry revealed mostly sinus tachycardia with PACs, suspect the patient has MAT given underlying pulmonary disease.  She also had new left bundle branch block since 2016.  She was placed on Cardizem.  TSH was normal.  Echocardiogram came back showing EF 40%, dyssynergy related to left bundle branch block.  Cardizem was discontinued given LV dysfunction.  Patient was not placed on beta-blocker either due to underlying lung disease.  She was started on goal-directed medical therapy including losartan, spironolactone and Farxiga.  Patient presents today for follow-up accompanied by her husband.  Her breathing is at her her baseline at this time.  She is using 2 to 3 L of oxygen at baseline on the 4 L when she walks around.  Her lungs is clear without active wheezing.  She has minimal crackles in the base of the lung.  After she left the hospital, she did have a bandlike chest discomfort below her breast on both side.  Symptom was intermittent for 2 to 3 days before self resolving.  EKG today showed no obvious ST-T wave changes.  Interestingly, EKG today shows resolution of  left bundle branch block that was previously seen in the hospital.  We discussed her hospital course.  I will send a staff message to Dr. Vaughan Browner to review the pulmonary nodule.  She is currently on Farxiga, losartan and spironolactone.  Her blood pressure is borderline, therefore I could not uptitrate heart failure therapy.  We discussed the importance of proceeding with PET stress test in the next few weeks.  Risk and the benefit of the stress test has been discussed with the patient and family.  In 3 months, she will have a repeat limited echocardiogram to take a look at her ejection fraction to make sure her EF has improved.  She can follow-up with Dr. Martinique afterward.  Note, she does have at least 2+ pitting edema in her ankle, I will resume Lasix at the previous 20 mg daily.  She will need a basic metabolic panel in 7 days to follow-up on her renal function.  Past Medical History:  Diagnosis Date   Arthritis    hips,shoulders   Asthma    Complete rupture of left rotator cuff 09/28/2015   Diabetes mellitus without complication (HCC)    GERD (gastroesophageal reflux disease)    with spicy foods   Glaucoma    Hyperlipidemia    Hypertension    PONV (postoperative nausea and vomiting)    Rotator cuff tear     Past Surgical History:  Procedure Laterality Date   ABDOMINAL HYSTERECTOMY  BLEPHAROPLASTY     BREAST EXCISIONAL BIOPSY Right 2003   Benign   BUNIONECTOMY WITH HAMMERTOE RECONSTRUCTION Bilateral    CHOLECYSTECTOMY     EYE SURGERY     cataract   SHOULDER ARTHROSCOPY WITH ROTATOR CUFF REPAIR Left 09/28/2015   Procedure: LEFT SHOULDER ARTHROSCOPY DEBRIDEMENT, ROTATOR CUFF REPAIR;  Surgeon: Marchia Bond, MD;  Location: [facility];  Service: Orthopedics;  Laterality: Left;   TRIGGER FINGER RELEASE Left    WRIST FRACTURE SURGERY Right     Current Medications: Current Meds  Medication Sig   ACCU-CHEK GUIDE test strip USE AS DIRECTED TO CHECK BLOOD SUGARS DAILY 90  DAYS   acetaminophen (TYLENOL 8 HOUR) 650 MG CR tablet Take 650 mg by mouth daily as needed for pain.   albuterol (PROVENTIL HFA;VENTOLIN HFA) 108 (90 BASE) MCG/ACT inhaler Inhale into the lungs every 6 (six) hours as needed for wheezing or shortness of breath.   ascorbic acid (VITAMIN C) 500 MG tablet Take 500 mg by mouth every evening.   brimonidine (ALPHAGAN P) 0.1 % SOLN Place 1 drop into both eyes in the morning and at bedtime.   budesonide-formoterol (SYMBICORT) 160-4.5 MCG/ACT inhaler Inhale 2 puffs into the lungs in the morning and at bedtime.   CRESTOR 10 MG tablet Take 10 mg by mouth every evening.   dapagliflozin propanediol (FARXIGA) 10 MG TABS tablet Take 1 tablet (10 mg total) by mouth daily before breakfast.   diclofenac (FLECTOR) 1.3 % PTCH Place 1 patch onto the skin daily as needed (for painful areas).   dorzolamide-timolol (COSOPT) 22.3-6.8 MG/ML ophthalmic solution Place 1 drop into both eyes 2 (two) times daily.   famotidine (PEPCID) 10 MG tablet Take 10 mg by mouth daily as needed for heartburn.   furosemide (LASIX) 20 MG tablet Take 1 tablet (20 mg total) by mouth daily.   glimepiride (AMARYL) 2 MG tablet Take 1-2 mg by mouth every morning. Take 1 tablet by mouth in the morning and 1/2 tablet with lunch   losartan (COZAAR) 25 MG tablet Take 1 tablet (25 mg total) by mouth daily.   LUMIGAN 0.01 % SOLN Place 1 drop into both eyes at bedtime.   Menthol, Topical Analgesic, (BIOFREEZE EX) Apply 1 application  topically at bedtime as needed (for nerve pain in feet).   metFORMIN (GLUCOPHAGE-XR) 500 MG 24 hr tablet Take 500 mg by mouth 2 (two) times daily with a meal.   sertraline (ZOLOFT) 50 MG tablet Take 50 mg by mouth daily.   spironolactone (ALDACTONE) 25 MG tablet Take 1/2  tablet (12.5 mg total) by mouth daily.   VITAMIN D PO Take 1,000 capsules by mouth every evening.     Allergies:   Oxycodone, Duricef [cefadroxil], Lipitor [atorvastatin], Lisinopril, and Vicodin  [hydrocodone-acetaminophen]   Social History   Socioeconomic History   Marital status: Married    Spouse name: Not on file   Number of children: Not on file   Years of education: Not on file   Highest education level: Not on file  Occupational History   Not on file  Tobacco Use   Smoking status: Former    Types: Cigarettes    Quit date: 1998    Years since quitting: 26.1    Passive exposure: Never   Smokeless tobacco: Never   Tobacco comments:    Smoked only 1-2 cigarettes per day  Vaping Use   Vaping Use: Never used  Substance and Sexual Activity   Alcohol use: Yes  Comment: social   Drug use: No   Sexual activity: Not on file  Other Topics Concern   Not on file  Social History Narrative   Not on file   Social Determinants of Health   Financial Resource Strain: Not on file  Food Insecurity: Not on file  Transportation Needs: Not on file  Physical Activity: Not on file  Stress: Not on file  Social Connections: Not on file     Family History: The patient's family history includes COPD in her sister.  ROS:   Please see the history of present illness.     All other systems reviewed and are negative.  EKGs/Labs/Other Studies Reviewed:    The following studies were reviewed today:  Echo 12/25/2022 1. Left ventricular ejection fraction, by estimation, is 35 to 40%. The  left ventricle has moderately decreased function. The left ventricle has  no regional wall motion abnormalities. Left ventricular diastolic  parameters are consistent with Grade I  diastolic dysfunction (impaired relaxation). Septal dyssynergy due to  LBBB.   2. Right ventricular systolic function is normal. The right ventricular  size is normal.   3. Left atrial size was moderately dilated.   4. The mitral valve is normal in structure. Trivial mitral valve  regurgitation. No evidence of mitral stenosis.   5. The aortic valve is calcified. There is mild calcification of the  aortic valve.  Aortic valve regurgitation is mild. No aortic stenosis is  present.   6. The inferior vena cava is normal in size with greater than 50%  respiratory variability, suggesting right atrial pressure of 3 mmHg.   EKG:  EKG is ordered today.  The ekg ordered today demonstrates normal sinus rhythm, heart rate 58 bpm, no significant ST-T wave changes.  QRS complex 82 ms.  Previous left bundle branch block resolved.  Recent Labs: 12/05/2022: NT-Pro BNP 291 12/23/2022: ALT 19 12/24/2022: B Natriuretic Peptide 47.4; Magnesium 1.8; TSH 1.611 12/25/2022: Hemoglobin 10.5; Platelets 310 12/26/2022: BUN 21; Creatinine, Ser 1.00; Potassium 3.5; Sodium 139  Recent Lipid Panel No results found for: "CHOL", "TRIG", "HDL", "CHOLHDL", "VLDL", "LDLCALC", "LDLDIRECT"   Risk Assessment/Calculations:           Physical Exam:    VS:  BP (!) 112/52 (BP Location: Left Arm, Patient Position: Sitting, Cuff Size: Normal)   Pulse (!) 58   Ht '5\' 1"'$  (1.549 m)   Wt 164 lb (74.4 kg)   BMI 30.99 kg/m         Wt Readings from Last 3 Encounters:  01/15/23 164 lb (74.4 kg)  12/31/22 161 lb 6.4 oz (73.2 kg)  12/24/22 159 lb (72.1 kg)     GEN:  Well nourished, well developed in no acute distress HEENT: Normal NECK: No JVD; No carotid bruits LYMPHATICS: No lymphadenopathy CARDIAC: RRR, no murmurs, rubs, gallops RESPIRATORY:  Clear to auscultation without rales, wheezing or rhonchi  ABDOMEN: Soft, non-tender, non-distended MUSCULOSKELETAL:  No edema; No deformity  SKIN: Warm and dry NEUROLOGIC:  Alert and oriented x 3 PSYCHIATRIC:  Normal affect   ASSESSMENT:    1. LV dysfunction   2. Medication management   3. Precordial pain   4. Pulmonary fibrosis ([place])   5. Primary hypertension   6. Hyperlipidemia LDL goal <70   7. Controlled type 2 diabetes mellitus without complication, without long-term current use of insulin ([place])   8. Ankle edema    PLAN:    In order of problems listed above:  LV dysfunction:  EF 40% on echocardiogram 12/25/2022.  Currently on losartan spironolactone and Farxiga.  Patient does have 2+ ankle edema, will resume Lasix at 20 mg daily.  Obtain basic metabolic panel in 7 days.  Unable to further uptitrate heart failure therapy.  Dr. Martinique recommended PET stress test.  Repeat a limited echocardiogram in 3 months.  Note, previously seen left bundle branch block has resolved on today's repeat EKG.  Chest discomfort: Case discussed with Dr. Audie Box, she is on 2 to 3 L of oxygen at rest and 4 L with stress.  She does not have any active wheezing on physical exam.  She has been describing bandlike tightness across the lower chest below her breasts.  Will proceed with PET stress test especially given the recent LV dysfunction.  Pulmonary fibrosis: Followed by Dr. Vaughan Browner.  Recent chest x-ray also showed suggested a 6 mm possible new lung nodule.  This was not seen on the previous CT scan of the chest from October of last year.  Will defer additional workup to pulmonology service, will forward a message to Dr. Vaughan Browner.  Hypertension: Blood pressure stable  Hyperlipidemia: On Crestor  DM2: Managed by primary care provider  Bilateral ankle edema: She has at least 2+ pitting edema in bilateral ankles.  Will add back Lasix at 20 mg daily.  Basic metabolic panel in 7 days.      The risks [chest pain, shortness of breath, cardiac arrhythmias, dizziness, blood pressure fluctuations, myocardial infarction, stroke/transient ischemic attack, nausea, vomiting, allergic reaction, radiation exposure, metallic taste sensation and life-threatening complications (estimated to be 1 in 10,000)], benefits (risk stratification, diagnosing coronary artery disease, treatment guidance) and alternatives of a cardiac PET stress test were discussed in detail with Mrs. Wold and her husband who displayed clear understanding and agree to proceed.    Medication Adjustments/Labs and Tests Ordered: Current medicines  are reviewed at length with the patient today.  Concerns regarding medicines are outlined above.  Orders Placed This Encounter  Procedures   NM PET CT CARDIAC PERFUSION MULTI W/ABSOLUTE BLOODFLOW   Basic metabolic panel   Cardiac Stress Test: Informed Consent Details: Physician/Practitioner Attestation; Transcribe to consent form and obtain patient signature   EKG 12-Lead   ECHOCARDIOGRAM LIMITED   Meds ordered this encounter  Medications   furosemide (LASIX) 20 MG tablet    Sig: Take 1 tablet (20 mg total) by mouth daily.    Dispense:  90 tablet    Refill:  3    Patient Instructions  Medication Instructions:   START Furosemide (Lasix) 20 mg- 1 tablet by mouth daily   *If you need a refill on your cardiac medications before your next appointment, please call your pharmacy*  Lab Work: Your physician recommends that you return for lab work in 7 days:  BMP If you have labs (blood work) drawn today and your tests are completely normal, you will receive your results only by: [place] (if you have MyChart) OR A paper copy in the mail If you have any lab test that is abnormal or we need to change your treatment, we will call you to review the results.  Testing/Procedures: Your physician has requested that you have an echocardiogram. Echocardiography is a painless test that uses sound waves to create images of your heart. It provides your doctor with information about the size and shape of your heart and how well your heart's chambers and valves are working. This procedure takes approximately one hour. There are no restrictions for this  procedure. Please do NOT wear cologne, perfume, aftershave, or lotions (deodorant is allowed). Please arrive 15 minutes prior to your appointment time. Please schedule for 3 months   Your physician has requested that you have a Cardiac Pet Stress Test. This testing is completed at [facility] ([place], [place]  [place] [address]). The schedulers will call you to get this scheduled. Please follow instructions below and call the office with any questions/concerns [phone]).   Follow-Up: At [facility], you and your health needs are our priority.  As part of our continuing mission to provide you with exceptional heart care, we have created designated Provider Care Teams.  These Care Teams include your primary Cardiologist (physician) and Advanced Practice Providers (APPs -  Physician Assistants and Nurse Practitioners) who all work together to provide you with the care you need, when you need it.   Your next appointment:   3 month(s) after Echo   Provider:   Peter Martinique, MD     Other Instructions   How to Prepare for Your Cardiac PET/CT Stress Test:  1. Please do not take these medications before your test:   Medications that may interfere with the cardiac pharmacological stress agent (ex. nitrates - including erectile dysfunction medications, isosorbide mononitrate, tamulosin or beta-blockers) the day of the exam. (Erectile dysfunction medication should be held for at least 72 hrs prior to test) Theophylline containing medications for 12 hours. Dipyridamole 48 hours prior to the test. Your remaining medications may be taken with water.  2. Nothing to eat or drink, except water, 3 hours prior to arrival time.   NO caffeine/decaffeinated products, or chocolate 12 hours prior to arrival.  3. NO perfume, cologne or lotion  4. Total time is 1 to 2 hours; you may want to bring reading material for the waiting time.  5. Please report to Radiology at the [facility] Main Entrance 30 minutes early for your test.  [place],  [id number]  Diabetic Preparation:  Hold oral medications. You may take NPH and Lantus insulin. Do not take Humalog or Humulin R (Regular Insulin) the day of your test. Check blood sugars prior to leaving the house. If able to eat  breakfast prior to 3 hour fasting, you may take all medications, including your insulin, Do not worry if you miss your breakfast dose of insulin - start at your next meal.  IF YOU THINK YOU MAY BE PREGNANT, OR ARE NURSING PLEASE INFORM THE TECHNOLOGIST.  In preparation for your appointment, medication and supplies will be purchased.  Appointment availability is limited, so if you need to cancel or reschedule, please call the Radiology Department at [phone]  24 hours in advance to avoid a cancellation fee of $100.00  What to Expect After you Arrive:  Once you arrive and check in for your appointment, you will be taken to a preparation room within the Radiology Department.  A technologist or Nurse will obtain your medical history, verify that you are correctly prepped for the exam, and explain the procedure.  Afterwards,  an IV will be started in your arm and electrodes will be placed on your skin for EKG monitoring during the stress portion of the exam. Then you will be escorted to the PET/CT scanner.  There, staff will get you positioned on the scanner and obtain a blood pressure and EKG.  During the exam, you will continue to be connected to the EKG and blood pressure machines.  A small, safe amount of a radioactive tracer will be injected in your IV to obtain a series of pictures of your heart along with an injection of a stress agent.    After your Exam:  It is recommended that you eat a meal and drink a caffeinated beverage to counter act any effects of the stress agent.  Drink plenty of fluids for the remainder of the day and urinate frequently for the first couple of hours after the exam.  Your doctor will inform you of your test results within 7-10 business days.  For questions about your test or how to prepare for your test, please call: Marchia Bond, Cardiac Imaging Nurse Navigator  Gordy Clement, Cardiac Imaging Nurse Navigator Office: [phone]     Hilbert Corrigan, [place]   01/16/2023 11:45 AM    [place]

## 2023-01-15 NOTE — Patient Instructions (Signed)
Medication Instructions:   START Furosemide (Lasix) 20 mg- 1 tablet by mouth daily   *If you need a refill on your cardiac medications before your next appointment, please call your pharmacy*  Lab Work: Your physician recommends that you return for lab work in 7 days:  BMP If you have labs (blood work) drawn today and your tests are completely normal, you will receive your results only by: [place] (if you have MyChart) OR A paper copy in the mail If you have any lab test that is abnormal or we need to change your treatment, we will call you to review the results.  Testing/Procedures: Your physician has requested that you have an echocardiogram. Echocardiography is a painless test that uses sound waves to create images of your heart. It provides your doctor with information about the size and shape of your heart and how well your heart's chambers and valves are working. This procedure takes approximately one hour. There are no restrictions for this procedure. Please do NOT wear cologne, perfume, aftershave, or lotions (deodorant is allowed). Please arrive 15 minutes prior to your appointment time. Please schedule for 3 months   Your physician has requested that you have a Cardiac Pet Stress Test. This testing is completed at [facility] ([place], [place] [address]). The schedulers will call you to get this scheduled. Please follow instructions below and call the office with any questions/concerns [phone]).   Follow-Up: At [facility], you and your health needs are our priority.  As part of our continuing mission to provide you with exceptional heart care, we have created designated Provider Care Teams.  These Care Teams include your primary Cardiologist (physician) and Advanced Practice Providers (APPs -  Physician Assistants and Nurse Practitioners) who all work together to provide you with the care you need, when you need it.    Your next appointment:   3 month(s) after Echo   Provider:   Peter Martinique, MD     Other Instructions   How to Prepare for Your Cardiac PET/CT Stress Test:  1. Please do not take these medications before your test:   Medications that may interfere with the cardiac pharmacological stress agent (ex. nitrates - including erectile dysfunction medications, isosorbide mononitrate, tamulosin or beta-blockers) the day of the exam. (Erectile dysfunction medication should be held for at least 72 hrs prior to test) Theophylline containing medications for 12 hours. Dipyridamole 48 hours prior to the test. Your remaining medications may be taken with water.  2. Nothing to eat or drink, except water, 3 hours prior to arrival time.   NO caffeine/decaffeinated products, or chocolate 12 hours prior to arrival.  3. NO perfume, cologne or lotion  4. Total time is 1 to 2 hours; you may want to bring reading material for the waiting time.  5. Please report to Radiology at the [facility] Main Entrance 30 minutes early for your test.  [place], [place] [address]  Diabetic Preparation:  Hold oral medications. You may take NPH and Lantus insulin. Do not take Humalog or Humulin R (Regular Insulin) the day of your test. Check blood sugars prior to leaving the house. If able to eat breakfast prior to 3 hour fasting, you may take all medications, including your insulin, Do not worry if you miss your breakfast dose of insulin - start at your next meal.  IF YOU THINK YOU MAY BE PREGNANT, OR ARE NURSING PLEASE INFORM THE  TECHNOLOGIST.  In preparation for your appointment, medication and supplies will be purchased.  Appointment availability is limited, so if you need to cancel or reschedule, please call the Radiology Department at [phone]  24 hours in advance to avoid a cancellation fee of $100.00  What to Expect After you Arrive:  Once you arrive and check in for your  appointment, you will be taken to a preparation room within the Radiology Department.  A technologist or Nurse will obtain your medical history, verify that you are correctly prepped for the exam, and explain the procedure.  Afterwards,  an IV will be started in your arm and electrodes will be placed on your skin for EKG monitoring during the stress portion of the exam. Then you will be escorted to the PET/CT scanner.  There, staff will get you positioned on the scanner and obtain a blood pressure and EKG.  During the exam, you will continue to be connected to the EKG and blood pressure machines.  A small, safe amount of a radioactive tracer will be injected in your IV to obtain a series of pictures of your heart along with an injection of a stress agent.    After your Exam:  It is recommended that you eat a meal and drink a caffeinated beverage to counter act any effects of the stress agent.  Drink plenty of fluids for the remainder of the day and urinate frequently for the first couple of hours after the exam.  Your doctor will inform you of your test results within 7-10 business days.  For questions about your test or how to prepare for your test, please call: Marchia Bond, Cardiac Imaging Nurse Navigator  Gordy Clement, Cardiac Imaging Nurse Navigator Office: [phone]

## 2023-01-16 ENCOUNTER — Other Ambulatory Visit (HOSPITAL_COMMUNITY): Payer: Self-pay

## 2023-01-19 MED ORDER — DAPAGLIFLOZIN PROPANEDIOL 10 MG PO TABS: 10.0000 mg | ORAL_TABLET | Freq: Every day | ORAL | 3 refills | Status: DC

## 2023-01-19 MED ORDER — SPIRONOLACTONE 25 MG PO TABS: 12.5000 mg | ORAL_TABLET | Freq: Every day | ORAL | 3 refills | Status: DC

## 2023-01-19 MED ORDER — LOSARTAN POTASSIUM 25 MG PO TABS: 25.0000 mg | ORAL_TABLET | Freq: Every day | ORAL | 3 refills | Status: DC

## 2023-01-24 LAB — BASIC METABOLIC PANEL
BUN/Creatinine Ratio: 19 (ref 12–28)
BUN: 18 mg/dL (ref 8–27)
CO2: 20 mmol/L (ref 20–29)
Calcium: 9.7 mg/dL (ref 8.7–10.3)
Chloride: 103 mmol/L (ref 96–106)
Creatinine, Ser: 0.97 mg/dL (ref 0.57–1.00)
Glucose: 96 mg/dL (ref 70–99)
Potassium: 3.9 mmol/L (ref 3.5–5.2)
Sodium: 142 mmol/L (ref 134–144)
eGFR: 57 mL/min/{1.73_m2} — ABNORMAL LOW (ref >59)

## 2023-01-27 ENCOUNTER — Other Ambulatory Visit: Payer: Self-pay | Admitting: Internal Medicine

## 2023-01-27 DIAGNOSIS — Z1231 Encounter for screening mammogram for malignant neoplasm of breast: Secondary | ICD-10-CM

## 2023-02-23 ENCOUNTER — Telehealth (HOSPITAL_COMMUNITY): Payer: Self-pay | Admitting: *Deleted

## 2023-02-23 NOTE — Telephone Encounter (Signed)
Reaching out to patient to offer assistance regarding upcoming cardiac imaging study; pt verbalizes understanding of appt date/time, parking situation and where to check in, pre-test NPO status, and verified current allergies; name and call back number provided for further questions should they arise  Aailyah Dunbar RN Navigator Cardiac Imaging [facility] [phone] office [phone] cell  Patient aware to avoid caffeine 12 hours prior to her cardiac PET scan.  

## 2023-02-24 DIAGNOSIS — E119 Type 2 diabetes mellitus without complications: Secondary | ICD-10-CM | POA: Diagnosis not present

## 2023-02-24 DIAGNOSIS — I498 Other specified cardiac arrhythmias: Secondary | ICD-10-CM | POA: Diagnosis not present

## 2023-02-24 DIAGNOSIS — J849 Interstitial pulmonary disease, unspecified: Secondary | ICD-10-CM | POA: Diagnosis not present

## 2023-02-24 DIAGNOSIS — E876 Hypokalemia: Secondary | ICD-10-CM | POA: Diagnosis not present

## 2023-02-25 ENCOUNTER — Ambulatory Visit (HOSPITAL_COMMUNITY)
Admission: RE | Admit: 2023-02-25 | Discharge: 2023-02-25 | Disposition: A | Discharge status: Home / Self Care | Payer: Medicare PPO | Source: Ambulatory Visit | Attending: Physician Assistant | Admitting: Physician Assistant

## 2023-02-25 DIAGNOSIS — R072 Precordial pain: Secondary | ICD-10-CM | POA: Insufficient documentation

## 2023-02-25 LAB — NM PET CT CARDIAC PERFUSION MULTI W/ABSOLUTE BLOODFLOW
LV dias vol: 83 mL (ref 46–106)
LV sys vol: 45 mL
MBFR: 2.16
Nuc Rest EF: 46 %
Nuc Stress EF: 51 %
Peak HR: 84 {beats}/min
Rest HR: 61 {beats}/min
Rest MBF: 0.81 ml/g/min
Rest Nuclear Isotope Dose: 19.3 mCi
Rest perfusion cavity size (mL): 83 mL
ST Depression (mm): 0 mm
Stress MBF: 1.75 ml/g/min
Stress Nuclear Isotope Dose: 19.3 mCi
Stress perfusion cavity size (mL): 91 mL
TID: 1.06

## 2023-02-25 MED ORDER — RUBIDIUM RB82 GENERATOR (RUBYFILL)
19.3000 | PACK | Freq: Once | INTRAVENOUS | Status: AC
  Administered 2023-02-25: 19.3 via INTRAVENOUS

## 2023-02-25 MED ORDER — REGADENOSON 0.4 MG/5ML IV SOLN
INTRAVENOUS | Status: AC
  Filled 2023-02-25: qty 5

## 2023-02-25 MED ORDER — REGADENOSON 0.4 MG/5ML IV SOLN
0.4000 mg | Freq: Once | INTRAVENOUS | Status: AC
  Administered 2023-02-25: 0.4 mg via INTRAVENOUS

## 2023-03-16 DIAGNOSIS — Z01110 Encounter for hearing examination following failed hearing screening: Secondary | ICD-10-CM | POA: Diagnosis not present

## 2023-03-17 ENCOUNTER — Ambulatory Visit
Admission: RE | Admit: 2023-03-17 | Discharge: 2023-03-17 | Disposition: A | Discharge status: Home / Self Care | Payer: Medicare PPO | Source: Ambulatory Visit | Attending: Internal Medicine | Admitting: Internal Medicine

## 2023-03-17 DIAGNOSIS — Z1231 Encounter for screening mammogram for malignant neoplasm of breast: Secondary | ICD-10-CM

## 2023-03-25 DIAGNOSIS — F32A Depression, unspecified: Secondary | ICD-10-CM | POA: Diagnosis not present

## 2023-03-25 DIAGNOSIS — E1122 Type 2 diabetes mellitus with diabetic chronic kidney disease: Secondary | ICD-10-CM | POA: Diagnosis not present

## 2023-03-25 DIAGNOSIS — I4719 Other supraventricular tachycardia: Secondary | ICD-10-CM | POA: Diagnosis not present

## 2023-03-25 DIAGNOSIS — M792 Neuralgia and neuritis, unspecified: Secondary | ICD-10-CM | POA: Diagnosis not present

## 2023-03-25 DIAGNOSIS — J849 Interstitial pulmonary disease, unspecified: Secondary | ICD-10-CM | POA: Diagnosis not present

## 2023-03-25 DIAGNOSIS — R911 Solitary pulmonary nodule: Secondary | ICD-10-CM | POA: Diagnosis not present

## 2023-03-25 DIAGNOSIS — I700 Atherosclerosis of aorta: Secondary | ICD-10-CM | POA: Diagnosis not present

## 2023-03-25 DIAGNOSIS — I5020 Unspecified systolic (congestive) heart failure: Secondary | ICD-10-CM | POA: Diagnosis not present

## 2023-03-25 DIAGNOSIS — N1831 Chronic kidney disease, stage 3a: Secondary | ICD-10-CM | POA: Diagnosis not present

## 2023-03-25 DIAGNOSIS — E1169 Type 2 diabetes mellitus with other specified complication: Secondary | ICD-10-CM | POA: Diagnosis not present

## 2023-03-26 DIAGNOSIS — E119 Type 2 diabetes mellitus without complications: Secondary | ICD-10-CM | POA: Diagnosis not present

## 2023-03-26 DIAGNOSIS — I498 Other specified cardiac arrhythmias: Secondary | ICD-10-CM | POA: Diagnosis not present

## 2023-03-26 DIAGNOSIS — E876 Hypokalemia: Secondary | ICD-10-CM | POA: Diagnosis not present

## 2023-03-26 DIAGNOSIS — J849 Interstitial pulmonary disease, unspecified: Secondary | ICD-10-CM | POA: Diagnosis not present

## 2023-04-09 ENCOUNTER — Other Ambulatory Visit: Payer: Self-pay | Admitting: Physician Assistant

## 2023-04-14 NOTE — Progress Notes (Signed)
Cardiology Office Note:    Date:  01/16/2023   ID:  Cheryl Quinn, DOB November 04, 1933, MRN [id number]  PCP:  Thana Ates, MD   [facility] Providers Cardiologist:  Destynie Toomey [place], MD { Pulmonologist: Dr. Isaiah Serge  Referring MD: Kirby Funk, MD   Chief Complaint  Patient presents with   Follow-up   Edema    Feet and ankles.    History of Present Illness:    Cheryl Quinn is a 89 y.o. female with a hx of hypertension, hyperlipidemia, DM2 and history of pulmonary fibrosis.  Patient was recently admitted in late January 2024 with shortness of breath.  She was placed on supplemental oxygen due to hypoxia.  Chest x-ray showed ill-defined opacity within the bilateral lung bases which may reflect atelectasis or pneumonia, 6 mm nodular opacity in the right lung base.  Cardiology service consulted for tachycardia.  Telemetry revealed mostly sinus tachycardia with PACs, suspect the patient has MAT given underlying pulmonary disease.  She also had new left bundle branch block since 2016.  She was placed on Cardizem.  TSH was normal.  Echocardiogram came back showing EF 40%, dyssynergy related to left bundle branch block.  Cardizem was discontinued given LV dysfunction.  Patient was not placed on beta-blocker either due to underlying lung disease.  She was started on goal-directed medical therapy including losartan, spironolactone and Farxiga.  She was seen in follow up in Feb by Cheryl Quinn. It was noted her LBBB resolved. She was on Farxiga, losartan and spironolactone. She was resumed on lasix for edema. She had a cardiac PET CT that was low risk with no ischemia and EF 46% at rest and 51% with stress.   On follow up today she is seen with her husband. She states her breathing is doing well. Infrequent cough. Still using oxygen. Pulse ox at home shows HR typically in 60s. Denies any significant palpitations. Swelling in ankles is stable. Weight has actually dropped 4 lbs. Tolerating  medication well. Repeat Echo done yesterday shows EF improved to 40-45%.   Past Medical History:  Diagnosis Date   Arthritis    hips,shoulders   Asthma    Complete rupture of left rotator cuff 09/28/2015   Diabetes mellitus without complication (HCC)    GERD (gastroesophageal reflux disease)    with spicy foods   Glaucoma    Hyperlipidemia    Hypertension    PONV (postoperative nausea and vomiting)    Rotator cuff tear     Past Surgical History:  Procedure Laterality Date   ABDOMINAL HYSTERECTOMY     BLEPHAROPLASTY     BREAST EXCISIONAL BIOPSY Right 2003   Benign   BUNIONECTOMY WITH HAMMERTOE RECONSTRUCTION Bilateral    CHOLECYSTECTOMY     EYE SURGERY     cataract   SHOULDER ARTHROSCOPY WITH ROTATOR CUFF REPAIR Left 09/28/2015   Procedure: LEFT SHOULDER ARTHROSCOPY DEBRIDEMENT, ROTATOR CUFF REPAIR;  Surgeon: Teryl Lucy, MD;  Location: [facility];  Service: Orthopedics;  Laterality: Left;   TRIGGER FINGER RELEASE Left    WRIST FRACTURE SURGERY Right     Current Medications: Current Meds  Medication Sig   ACCU-CHEK GUIDE test strip USE AS DIRECTED TO CHECK BLOOD SUGARS DAILY 90 DAYS   acetaminophen (TYLENOL 8 HOUR) 650 MG CR tablet Take 650 mg by mouth daily as needed for pain.   albuterol (PROVENTIL HFA;VENTOLIN HFA) 108 (90 BASE) MCG/ACT inhaler Inhale into the lungs every 6 (six) hours as needed  for wheezing or shortness of breath.   ascorbic acid (VITAMIN C) 500 MG tablet Take 500 mg by mouth every evening.   brimonidine (ALPHAGAN P) 0.1 % SOLN Place 1 drop into both eyes in the morning and at bedtime.   budesonide-formoterol (SYMBICORT) 160-4.5 MCG/ACT inhaler Inhale 2 puffs into the lungs in the morning and at bedtime.   CRESTOR 10 MG tablet Take 10 mg by mouth every evening.   dapagliflozin propanediol (FARXIGA) 10 MG TABS tablet Take 1 tablet (10 mg total) by mouth daily before breakfast.   diclofenac (FLECTOR) 1.3 % PTCH Place 1 patch onto the skin  daily as needed (for painful areas).   dorzolamide-timolol (COSOPT) 22.3-6.8 MG/ML ophthalmic solution Place 1 drop into both eyes 2 (two) times daily.   famotidine (PEPCID) 10 MG tablet Take 10 mg by mouth daily as needed for heartburn.   furosemide (LASIX) 20 MG tablet Take 1 tablet (20 mg total) by mouth daily.   glimepiride (AMARYL) 2 MG tablet Take 1-2 mg by mouth every morning. Take 1 tablet by mouth in the morning and 1/2 tablet with lunch   losartan (COZAAR) 25 MG tablet Take 1 tablet (25 mg total) by mouth daily.   LUMIGAN 0.01 % SOLN Place 1 drop into both eyes at bedtime.   Menthol, Topical Analgesic, (BIOFREEZE EX) Apply 1 application  topically at bedtime as needed (for nerve pain in feet).   metFORMIN (GLUCOPHAGE-XR) 500 MG 24 hr tablet Take 500 mg by mouth 2 (two) times daily with a meal.   sertraline (ZOLOFT) 50 MG tablet Take 50 mg by mouth daily.   spironolactone (ALDACTONE) 25 MG tablet Take 1/2  tablet (12.5 mg total) by mouth daily.   VITAMIN D PO Take 1,000 capsules by mouth every evening.     Allergies:   Oxycodone, Duricef [cefadroxil], Lipitor [atorvastatin], Lisinopril, and Vicodin [hydrocodone-acetaminophen]   Social History   Socioeconomic History   Marital status: Married    Spouse name: Not on file   Number of children: Not on file   Years of education: Not on file   Highest education level: Not on file  Occupational History   Not on file  Tobacco Use   Smoking status: Former    Types: Cigarettes    Quit date: 52    Years since quitting: 26.1    Passive exposure: Never   Smokeless tobacco: Never   Tobacco comments:    Smoked only 1-2 cigarettes per day  Vaping Use   Vaping Use: Never used  Substance and Sexual Activity   Alcohol use: Yes    Comment: social   Drug use: No   Sexual activity: Not on file  Other Topics Concern   Not on file  Social History Narrative   Not on file   Social Determinants of Health   Financial Resource Strain:  Not on file  Food Insecurity: Not on file  Transportation Needs: Not on file  Physical Activity: Not on file  Stress: Not on file  Social Connections: Not on file     Family History: The patient's family history includes COPD in her sister.  ROS:   Please see the history of present illness.     All other systems reviewed and are negative.  EKGs/Labs/Other Studies Reviewed:    The following studies were reviewed today:  Echo 12/25/2022 1. Left ventricular ejection fraction, by estimation, is 35 to 40%. The  left ventricle has moderately decreased function. The left ventricle has  no regional wall motion abnormalities. Left ventricular diastolic  parameters are consistent with Grade I  diastolic dysfunction (impaired relaxation). Septal dyssynergy due to  LBBB.   2. Right ventricular systolic function is normal. The right ventricular  size is normal.   3. Left atrial size was moderately dilated.   4. The mitral valve is normal in structure. Trivial mitral valve  regurgitation. No evidence of mitral stenosis.   5. The aortic valve is calcified. There is mild calcification of the  aortic valve. Aortic valve regurgitation is mild. No aortic stenosis is  present.   6. The inferior vena cava is normal in size with greater than 50%  respiratory variability, suggesting right atrial pressure of 3 mmHg.   Cardiac PET CT 02/25/23: Study Result  Narrative & Impression      LV perfusion is normal. There is no evidence of ischemia. There is no evidence of infarction.   Rest left ventricular function is normal. Rest EF: 46 %. Stress left ventricular function is normal. Stress EF: 51 %. End diastolic cavity size is normal. End systolic cavity size is normal.   Myocardial blood flow was computed to be 0.47ml/g/min at rest and 1.12ml/g/min at stress. Global myocardial blood flow reserve was 2.16 and was normal.   Coronary calcium was absent on the attenuation correction CT images.   The study  is normal. The study is low risk.   Electronically signed by Lennie Odor, MD   Echo 04/15/23: IMPRESSIONS     1. Left ventricular ejection fraction, by estimation, is 45 to 50%. The  left ventricle has mildly decreased function. The left ventricle  demonstrates global hypokinesis.   2. Right ventricular systolic function is normal. The right ventricular  size is normal. There is normal pulmonary artery systolic pressure. The  estimated right ventricular systolic pressure is 34.1 mmHg.   3. The mitral valve is normal in structure. No evidence of mitral valve  regurgitation. No evidence of mitral stenosis.   4. The aortic valve is tricuspid. Aortic valve regurgitation is trivial.  No aortic stenosis is present.   5. The inferior vena cava is normal in size with greater than 50%  respiratory variability, suggesting right atrial pressure of 3 mmHg.    EKG:  EKG is not ordered today.    Recent Labs: 12/05/2022: NT-Pro BNP 291 12/23/2022: ALT 19 12/24/2022: B Natriuretic Peptide 47.4; Magnesium 1.8; TSH 1.611 12/25/2022: Hemoglobin 10.5; Platelets 310 12/26/2022: BUN 21; Creatinine, Ser 1.00; Potassium 3.5; Sodium 139  Recent Lipid Panel No results found for: "CHOL", "TRIG", "HDL", "CHOLHDL", "VLDL", "LDLCALC", "LDLDIRECT"  Dated 03/25/23: A1c 6.1%   Risk Assessment/Calculations:           Physical Exam:    VS:  BP (!) 112/52 (BP Location: Left Arm, Patient Position: Sitting, Cuff Size: Normal)   Pulse (!) 58   Ht 5\' 1"  (1.549 m)   Wt 164 lb (74.4 kg)   BMI 30.99 kg/m         Wt Readings from Last 3 Encounters:  01/15/23 164 lb (74.4 kg)  12/31/22 161 lb 6.4 oz (73.2 kg)  12/24/22 159 lb (72.1 kg)     GEN:  Well nourished, well developed in no acute distress HEENT: Normal NECK: No JVD; No carotid bruits LYMPHATICS: No lymphadenopathy CARDIAC: RRR, no murmurs, rubs, gallops RESPIRATORY:  Clear to auscultation without rales, wheezing or rhonchi  ABDOMEN: Soft,  non-tender, non-distended MUSCULOSKELETAL:  No edema; No deformity  SKIN: Warm and dry NEUROLOGIC:  Alert and oriented x 3 PSYCHIATRIC:  Normal affect   ASSESSMENT:    1. LV dysfunction   2. Medication management   3. Precordial pain   4. Pulmonary fibrosis (HCC)   5. Primary hypertension   6. Hyperlipidemia LDL goal <70   7. Controlled type 2 diabetes mellitus without complication, without long-term current use of insulin (HCC)   8. Ankle edema    PLAN:    In order of problems listed above:  LV dysfunction: EF 35-40% on echocardiogram 12/25/2022.  Currently on losartan,  spironolactone, and [place].  Patient does have 1+ ankle edema. Will continue current therapy. EF improved to 40-45% by Echo. 46% by PET CT.  No evidence of ischemia on stress testing.   Chest discomfort: Case discussed with Dr. Flora Lipps, she is on 2 to 3 L of oxygen at rest and 4 L with stress.  She does not have any active wheezing on physical exam.  She has been describing bandlike tightness across the lower chest below her breasts.  Will proceed with PET stress test especially given the recent LV dysfunction.  Hypertension: Blood pressure controlled.  Hyperlipidemia: On Crestor  DM2: Managed by primary care provider  6.   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 Improved.    Will follow up in 6 months    Signed, Alvaro Aungst [place] MD, [facility]  01/16/2023 11:45 AM    [facility]

## 2023-04-15 ENCOUNTER — Ambulatory Visit (HOSPITAL_COMMUNITY): Payer: Medicare PPO | Attending: Cardiology

## 2023-04-15 DIAGNOSIS — I519 Heart disease, unspecified: Secondary | ICD-10-CM | POA: Diagnosis not present

## 2023-04-15 DIAGNOSIS — E119 Type 2 diabetes mellitus without complications: Secondary | ICD-10-CM | POA: Diagnosis not present

## 2023-04-15 DIAGNOSIS — I119 Hypertensive heart disease without heart failure: Secondary | ICD-10-CM | POA: Insufficient documentation

## 2023-04-15 DIAGNOSIS — E785 Hyperlipidemia, unspecified: Secondary | ICD-10-CM | POA: Diagnosis not present

## 2023-04-15 DIAGNOSIS — Z87891 Personal history of nicotine dependence: Secondary | ICD-10-CM | POA: Insufficient documentation

## 2023-04-15 LAB — ECHOCARDIOGRAM LIMITED
Area-P 1/2: 2.62 cm2
S' Lateral: 3.2 cm

## 2023-04-17 ENCOUNTER — Ambulatory Visit: Payer: Medicare PPO | Attending: Cardiology | Admitting: Cardiology

## 2023-04-17 ENCOUNTER — Encounter: Payer: Self-pay | Admitting: Cardiology

## 2023-04-17 VITALS — BP 132/68 | HR 75 | Ht 61.0 in | Wt 160.6 lb

## 2023-04-17 DIAGNOSIS — J8410 Pulmonary fibrosis, unspecified: Secondary | ICD-10-CM | POA: Diagnosis not present

## 2023-04-17 DIAGNOSIS — I519 Heart disease, unspecified: Secondary | ICD-10-CM

## 2023-04-17 DIAGNOSIS — I4719 Other supraventricular tachycardia: Secondary | ICD-10-CM | POA: Diagnosis not present

## 2023-04-17 DIAGNOSIS — I5022 Chronic systolic (congestive) heart failure: Secondary | ICD-10-CM | POA: Diagnosis not present

## 2023-04-17 NOTE — Patient Instructions (Signed)
Medication Instructions:  Continue same medications *If you need a refill on your cardiac medications before your next appointment, please call your pharmacy*   Lab Work: None ordered   Testing/Procedures: None ordered   Follow-Up: At [facility], you and your health needs are our priority.  As part of our continuing mission to provide you with exceptional heart care, we have created designated Provider Care Teams.  These Care Teams include your primary Cardiologist (physician) and Advanced Practice Providers (APPs -  Physician Assistants and Nurse Practitioners) who all work together to provide you with the care you need, when you need it.  We recommend signing up for the patient portal called "MyChart".  Sign up information is provided on this After Visit Summary.  MyChart is used to connect with patients for Virtual Visits (Telemedicine).  Patients are able to view lab/test results, encounter notes, upcoming appointments, etc.  Non-urgent messages can be sent to your provider as well.   To learn more about what you can do with MyChart, go to [email or web address].    Your next appointment:  6 months    Provider:  Azalee Course PA

## 2023-04-20 ENCOUNTER — Other Ambulatory Visit: Payer: Self-pay | Admitting: Physician Assistant

## 2023-05-15 DIAGNOSIS — J849 Interstitial pulmonary disease, unspecified: Secondary | ICD-10-CM | POA: Diagnosis not present

## 2023-05-15 DIAGNOSIS — E119 Type 2 diabetes mellitus without complications: Secondary | ICD-10-CM | POA: Diagnosis not present

## 2023-05-15 DIAGNOSIS — E876 Hypokalemia: Secondary | ICD-10-CM | POA: Diagnosis not present

## 2023-05-15 DIAGNOSIS — I498 Other specified cardiac arrhythmias: Secondary | ICD-10-CM | POA: Diagnosis not present

## 2023-05-26 ENCOUNTER — Encounter: Payer: Self-pay | Admitting: Cardiology

## 2023-06-03 DIAGNOSIS — M170 Bilateral primary osteoarthritis of knee: Secondary | ICD-10-CM | POA: Diagnosis not present

## 2023-06-04 DIAGNOSIS — H1131 Conjunctival hemorrhage, right eye: Secondary | ICD-10-CM | POA: Diagnosis not present

## 2023-06-10 DIAGNOSIS — M170 Bilateral primary osteoarthritis of knee: Secondary | ICD-10-CM | POA: Diagnosis not present

## 2023-06-10 DIAGNOSIS — M19011 Primary osteoarthritis, right shoulder: Secondary | ICD-10-CM | POA: Diagnosis not present

## 2023-06-14 DIAGNOSIS — J849 Interstitial pulmonary disease, unspecified: Secondary | ICD-10-CM | POA: Diagnosis not present

## 2023-06-14 DIAGNOSIS — E119 Type 2 diabetes mellitus without complications: Secondary | ICD-10-CM | POA: Diagnosis not present

## 2023-06-14 DIAGNOSIS — I498 Other specified cardiac arrhythmias: Secondary | ICD-10-CM | POA: Diagnosis not present

## 2023-06-14 DIAGNOSIS — E876 Hypokalemia: Secondary | ICD-10-CM | POA: Diagnosis not present

## 2023-06-17 DIAGNOSIS — M170 Bilateral primary osteoarthritis of knee: Secondary | ICD-10-CM | POA: Diagnosis not present

## 2023-07-01 DIAGNOSIS — M19011 Primary osteoarthritis, right shoulder: Secondary | ICD-10-CM | POA: Diagnosis not present

## 2023-07-08 ENCOUNTER — Ambulatory Visit: Payer: Medicare PPO | Admitting: Pulmonary Disease

## 2023-07-08 ENCOUNTER — Encounter: Payer: Self-pay | Admitting: Pulmonary Disease

## 2023-07-08 VITALS — BP 100/62 | HR 61 | Temp 98.0°F | Ht 61.0 in | Wt 158.8 lb

## 2023-07-08 DIAGNOSIS — J849 Interstitial pulmonary disease, unspecified: Secondary | ICD-10-CM | POA: Diagnosis not present

## 2023-07-08 NOTE — Patient Instructions (Signed)
I am glad you are doing well with your breathing Continue the inhaler and supplemental oxygen Will order high-res CT and PFTs in 6 months Return to clinic in 6 months

## 2023-07-08 NOTE — Progress Notes (Signed)
Cheryl Quinn    [id number]    [birth year]  Primary Care Physician:Raju, Dorris Singh, MD  Referring Physician: Thana Ates, MD [address] [place],  [place] [address]  Chief complaint: Follow-up for interstitial lung disease  HPI: 89 y.o. who  has a past medical history of Arthritis, Asthma, Complete rupture of left rotator cuff (09/28/2015), Diabetes mellitus without complication (HCC), GERD (gastroesophageal reflux disease), Glaucoma, Hyperlipidemia, Hypertension, PONV (postoperative nausea and vomiting), and Rotator cuff tear.   Referred here for evaluation of interstitial lung disease.  She had cough and shortness of breath which is worsened after she developed pneumonia seen outpatient earlier this year.  She had several chest x-rays and at least 2 rounds of antibiotics with some improvement but continues to have mild persistent symptoms.  She has history of recurrent bronchitis and asthma but not on controller medication.  She is using albuterol as needed.  History also notable for seasonal allergies and occasional GERD symptoms  She has seen Dr. Dimple Casey from Rheumatology in November 2023 for elevated rheumatoid factor.  He feels there is no evidence of rheumatoid arthritis but she has advanced osteoarthritis.  She was given a trial of prednisone in December 2023 for presumptive diagnosis of HP as she had a exposure to down She feels that the prednisone actually make her breathing worse and sugars are elevated Reviewed her case at multidisciplinary conference in January 2024 and findings were felt to be more consistent with IPF  In December 2023 after informed decision making we have decided to try Esbriet but after review of side effects they are having second thoughts on this medication and did not want to start it  Hospitalized in February 2023 with multifocal atrial tachycardia, acute hypoxic respiratory failure secondary to ILD and has been started on  supplemental oxygen at 2 L.  Pets: No pets Occupation: Retired Occupational psychologist Exposures: Has down pillows.  No mold, hot tub, Jacuzzi. ILD questionnaire 07/21/2022-negative except for above Smoking history: Smoked 2 to 3 cigarettes a day for 45 years.  Quit in 2000 Travel history: No significant travel history Relevant family history: Sister has COPD  Interim history: Continues on Symbicort, supplemental oxygen She was given a Designer, jewellery last visit and she likes it very much and was able to go to the beach with her family  Outpatient Encounter Medications as of 07/08/2023  Medication Sig   ACCU-CHEK GUIDE test strip USE AS DIRECTED TO CHECK BLOOD SUGARS DAILY 90 DAYS   acetaminophen (TYLENOL 8 HOUR) 650 MG CR tablet Take 650 mg by mouth daily as needed for pain.   albuterol (PROVENTIL HFA;VENTOLIN HFA) 108 (90 BASE) MCG/ACT inhaler Inhale into the lungs every 6 (six) hours as needed for wheezing or shortness of breath.   ascorbic acid (VITAMIN C) 500 MG tablet Take 500 mg by mouth every evening.   brimonidine (ALPHAGAN P) 0.1 % SOLN Place 1 drop into both eyes in the morning and at bedtime.   CRESTOR 10 MG tablet Take 10 mg by mouth every evening.   diclofenac (FLECTOR) 1.3 % PTCH Place 1 patch onto the skin daily as needed (for painful areas).   dorzolamide-timolol (COSOPT) 22.3-6.8 MG/ML ophthalmic solution Place 1 drop into both eyes 2 (two) times daily.   famotidine (PEPCID) 10 MG tablet Take 10 mg by mouth daily as needed for heartburn.   FARXIGA 10 MG TABS tablet TAKE 1 TABLET BY MOUTH DAILY BEFORE  BREAKFAST.   furosemide (LASIX) 20 MG tablet Take 1 tablet (20 mg total) by mouth daily.   glimepiride (AMARYL) 2 MG tablet Take 1-2 mg by mouth every morning. Take 1 tablet by mouth in the morning and 1/2 tablet with lunch   losartan (COZAAR) 25 MG tablet TAKE 1 TABLET (25 MG TOTAL) BY MOUTH DAILY.   LUMIGAN 0.01 % SOLN Place 1 drop into both eyes at bedtime.   Menthol, Topical  Analgesic, (BIOFREEZE EX) Apply 1 application  topically at bedtime as needed (for nerve pain in feet).   metFORMIN (GLUCOPHAGE-XR) 500 MG 24 hr tablet Take 500 mg by mouth 2 (two) times daily with a meal.   spironolactone (ALDACTONE) 25 MG tablet TAKE 1/2 TABLET BY MOUTH DAILY.   VITAMIN D PO Take 1,000 capsules by mouth every evening.   budesonide-formoterol (SYMBICORT) 160-4.5 MCG/ACT inhaler Inhale 2 puffs into the lungs in the morning and at bedtime. (Patient not taking: Reported on 07/08/2023)   [DISCONTINUED] sertraline (ZOLOFT) 50 MG tablet Take 50 mg by mouth daily. (Patient not taking: Reported on 07/08/2023)   No facility-administered encounter medications on file as of 07/08/2023.   Physical Exam: Blood pressure 100/62, pulse 61, temperature 98 F (36.7 C), temperature source Oral, height 5\' 1"  (1.549 m), weight 158 lb 12.8 oz (72 kg), SpO2 91%. Gen:      No acute distress HEENT:  EOMI, sclera anicteric Neck:     No masses; no thyromegaly Lungs:   Bibasal crackles CV:         Regular rate and rhythm; no murmurs Abd:      + bowel sounds; soft, non-tender; no palpable masses, no distension Ext:    No edema; adequate peripheral perfusion Skin:      Warm and dry; no rash Neuro: alert and oriented x 3 Psych: normal mood and affect   Data Reviewed: Imaging: Chest x-ray 04/15/2022-diffuse interstitial thickening. Chest x-ray 05/06/2022-bibasal opacities Chest x-ray 06/05/2022-chronic interstitial opacities with bibasal atelectasis High-resolution CT chest 09/02/2022-mild pulmonary fibrosis with basal gradient, septal thickening, traction bronchiectasis and bronchiolectasis.  Probable UIP.  Kidney lesions noted. I reviewed the images personally  PFTs: 09/15/2022 FVC 2.08 [111%], FEV1 2.04 [136%], F/F 90, TLC 4.37 [93%], DLCO 14.29 [84%] Normal test, Airtrapping and bronchodilator response suggests minimal airway obstruction  Labs: CTD serologies 07/21/2022-ANA 1: 180, cytoplasmic.   Rheumatoid factor 6.6  Assessment:  Evaluation for interstitial lung disease Reviewed CT which shows mild fibrosis and probable UIP pattern.  She does have arthritis symptoms with elevated ANA and rheumatoid factor.  Noted to have ongoing exposure to down feather  We reviewed findings today and possibilities include chronic HP or IPF.  No evidence of rheumatoid arthritis on rheumatology evaluation.  I do not believe she is a good candidate for bronchoscopy or lung biopsy given her age  She has gotten rid of down pillows in December 2023.  Has not responded well to steroids which made her symptoms worse. After discussion at ILD conference it was felt that the presentation is more consistent with IPF.  Now off steroids.  We had prescribed Esbriet but after review of side effects he would like to avoid and focus on symptom management and quality of life.  I agree with this decision as she has a tendency to have side effects with medications Continue supplemental oxygen via portable concentrator.  Kidney lesions Noted on recent CT chest.  She has been referred to urology but has not heard from them.  She  and her husband feel that they would not want surgery or chemotherapy even if malignancy was diagnosed and referral was canceled.  Asthma PFTs reviewed with trapping and bronchodilator response suggesting airway disease.  She also has trapping noted on CT scan Continue Symbicort  Plan/Recommendations: Continue Symbicort High-res CT, PFTs in 6 months Follow-up in 6 months.  Chilton Greathouse MD [facility] 07/08/2023, 2:41 PM  CC: Thana Ates, MD

## 2023-07-14 DIAGNOSIS — H401133 Primary open-angle glaucoma, bilateral, severe stage: Secondary | ICD-10-CM | POA: Diagnosis not present

## 2023-07-15 DIAGNOSIS — E119 Type 2 diabetes mellitus without complications: Secondary | ICD-10-CM | POA: Diagnosis not present

## 2023-07-15 DIAGNOSIS — I498 Other specified cardiac arrhythmias: Secondary | ICD-10-CM | POA: Diagnosis not present

## 2023-07-15 DIAGNOSIS — J849 Interstitial pulmonary disease, unspecified: Secondary | ICD-10-CM | POA: Diagnosis not present

## 2023-07-15 DIAGNOSIS — E876 Hypokalemia: Secondary | ICD-10-CM | POA: Diagnosis not present

## 2023-07-28 ENCOUNTER — Ambulatory Visit
Admission: RE | Admit: 2023-07-28 | Discharge: 2023-07-28 | Disposition: A | Discharge status: Home / Self Care | Payer: Medicare PPO | Source: Ambulatory Visit | Attending: Pulmonary Disease | Admitting: Pulmonary Disease

## 2023-07-28 DIAGNOSIS — J849 Interstitial pulmonary disease, unspecified: Secondary | ICD-10-CM

## 2023-07-28 DIAGNOSIS — J8410 Pulmonary fibrosis, unspecified: Secondary | ICD-10-CM | POA: Diagnosis not present

## 2023-08-07 DIAGNOSIS — H401133 Primary open-angle glaucoma, bilateral, severe stage: Secondary | ICD-10-CM | POA: Diagnosis not present

## 2023-08-07 DIAGNOSIS — H52203 Unspecified astigmatism, bilateral: Secondary | ICD-10-CM | POA: Diagnosis not present

## 2023-08-07 DIAGNOSIS — E119 Type 2 diabetes mellitus without complications: Secondary | ICD-10-CM | POA: Diagnosis not present

## 2023-08-07 DIAGNOSIS — Z961 Presence of intraocular lens: Secondary | ICD-10-CM | POA: Diagnosis not present

## 2023-08-08 DIAGNOSIS — Z833 Family history of diabetes mellitus: Secondary | ICD-10-CM | POA: Diagnosis not present

## 2023-08-08 DIAGNOSIS — R32 Unspecified urinary incontinence: Secondary | ICD-10-CM | POA: Diagnosis not present

## 2023-08-08 DIAGNOSIS — K219 Gastro-esophageal reflux disease without esophagitis: Secondary | ICD-10-CM | POA: Diagnosis not present

## 2023-08-08 DIAGNOSIS — J45909 Unspecified asthma, uncomplicated: Secondary | ICD-10-CM | POA: Diagnosis not present

## 2023-08-08 DIAGNOSIS — E785 Hyperlipidemia, unspecified: Secondary | ICD-10-CM | POA: Diagnosis not present

## 2023-08-08 DIAGNOSIS — M8580 Other specified disorders of bone density and structure, unspecified site: Secondary | ICD-10-CM | POA: Diagnosis not present

## 2023-08-08 DIAGNOSIS — M1990 Unspecified osteoarthritis, unspecified site: Secondary | ICD-10-CM | POA: Diagnosis not present

## 2023-08-08 DIAGNOSIS — F419 Anxiety disorder, unspecified: Secondary | ICD-10-CM | POA: Diagnosis not present

## 2023-08-08 DIAGNOSIS — F325 Major depressive disorder, single episode, in full remission: Secondary | ICD-10-CM | POA: Diagnosis not present

## 2023-08-08 DIAGNOSIS — I509 Heart failure, unspecified: Secondary | ICD-10-CM | POA: Diagnosis not present

## 2023-08-08 DIAGNOSIS — N1831 Chronic kidney disease, stage 3a: Secondary | ICD-10-CM | POA: Diagnosis not present

## 2023-08-08 DIAGNOSIS — Z974 Presence of external hearing-aid: Secondary | ICD-10-CM | POA: Diagnosis not present

## 2023-08-10 DIAGNOSIS — I4719 Other supraventricular tachycardia: Secondary | ICD-10-CM | POA: Diagnosis not present

## 2023-08-10 DIAGNOSIS — E1169 Type 2 diabetes mellitus with other specified complication: Secondary | ICD-10-CM | POA: Diagnosis not present

## 2023-08-10 DIAGNOSIS — Z79899 Other long term (current) drug therapy: Secondary | ICD-10-CM | POA: Diagnosis not present

## 2023-08-10 DIAGNOSIS — J84112 Idiopathic pulmonary fibrosis: Secondary | ICD-10-CM | POA: Diagnosis not present

## 2023-08-10 DIAGNOSIS — N1831 Chronic kidney disease, stage 3a: Secondary | ICD-10-CM | POA: Diagnosis not present

## 2023-08-10 DIAGNOSIS — I5032 Chronic diastolic (congestive) heart failure: Secondary | ICD-10-CM | POA: Diagnosis not present

## 2023-08-10 DIAGNOSIS — J9611 Chronic respiratory failure with hypoxia: Secondary | ICD-10-CM | POA: Diagnosis not present

## 2023-08-10 DIAGNOSIS — E041 Nontoxic single thyroid nodule: Secondary | ICD-10-CM | POA: Diagnosis not present

## 2023-08-10 DIAGNOSIS — F32A Depression, unspecified: Secondary | ICD-10-CM | POA: Diagnosis not present

## 2023-08-11 ENCOUNTER — Other Ambulatory Visit: Payer: Self-pay | Admitting: Internal Medicine

## 2023-08-11 DIAGNOSIS — E041 Nontoxic single thyroid nodule: Secondary | ICD-10-CM

## 2023-08-15 DIAGNOSIS — E119 Type 2 diabetes mellitus without complications: Secondary | ICD-10-CM | POA: Diagnosis not present

## 2023-08-15 DIAGNOSIS — I498 Other specified cardiac arrhythmias: Secondary | ICD-10-CM | POA: Diagnosis not present

## 2023-08-15 DIAGNOSIS — E876 Hypokalemia: Secondary | ICD-10-CM | POA: Diagnosis not present

## 2023-08-15 DIAGNOSIS — J849 Interstitial pulmonary disease, unspecified: Secondary | ICD-10-CM | POA: Diagnosis not present

## 2023-08-17 ENCOUNTER — Ambulatory Visit: Payer: Medicare PPO | Admitting: Physician Assistant

## 2023-08-18 ENCOUNTER — Ambulatory Visit
Admission: RE | Admit: 2023-08-18 | Discharge: 2023-08-18 | Disposition: A | Discharge status: Home / Self Care | Payer: Medicare PPO | Source: Ambulatory Visit | Attending: Internal Medicine | Admitting: Internal Medicine

## 2023-08-18 DIAGNOSIS — E042 Nontoxic multinodular goiter: Secondary | ICD-10-CM | POA: Diagnosis not present

## 2023-08-18 DIAGNOSIS — E041 Nontoxic single thyroid nodule: Secondary | ICD-10-CM

## 2023-08-21 ENCOUNTER — Ambulatory Visit: Payer: Medicare PPO | Admitting: Physician Assistant

## 2023-08-23 NOTE — Progress Notes (Unsigned)
Cardiology Clinic Note   Date: 08/23/2023 ID: Cheryl Quinn, DOB Apr 13, 1933, MRN [id number]  Primary Cardiologist:  Peter Sw[place]il[place], MD  Patient Profile    Cheryl Quinn is a 89 y.o. female who presents to the clinic today for ***    Past medical history significant for: Atrial arrhythmia. LV dysfunction. Echo 12/25/2022: EF 35 to 40%.  Grade I DD.  Septal dyssynergy due to LBBB.  Normal RV function.  Moderate LAE.  Trivial MR.  Mild calcification of aortic valve without stenosis.  Mild AI. Cardiac PET/CT 02/25/2023: Normal LV perfusion.  No evidence of ischemia/infarction.  EF 46% at rest and 51% with stress. Limited echo 04/15/2023: EF 45 to 50%.  Global hypokinesis.  Normal RV function.  Normal PA pressure.  Trivial AI. Hypertension. Hyperlipidemia.*** Interstitial lung disease/pulmonary fibrosis. T2DM. Glaucoma.     History of Present Illness    Cheryl Quinn is followed by Dr. Swaziland for the above outlined history.  In summary, patient underwent hospital admission in late January 2024 for shortness of breath.  She was placed on supplemental O2.  Chest x-ray showed ill-defined opacity within the bilateral lung bases which may reflect atelectasis or pneumonia, 6 mm nodular opacity in the right lung base.  Cardiology was consulted for tachycardia.  Telemetry revealed mostly sinus tachycardia with PACs, suspect the patient has MAT given underlying pulmonary disease.  She also had new LBBB since 2016.  She was placed on Cardizem.  TSH was normal.  Echo showed EF 40%, dyssynergy related to left bundle branch block.  Cardizem was discontinued.  Patient was not placed on BB secondary to underlying lung disease.  She was started on GDMT with losartan, spironolactone, Marcelline Deist.  On follow-up in February 2024 LBBB was resolved.  She was resumed on Lasix for edema.  Cardiac PET/CT was low risk with no ischemia and EF 46% at rest and 51% with stress.  Patient was last seen in the office on  04/17/2023 by Dr. Swaziland.  She was doing well at that time and no medication changes were made.  Today, patient ***  Atrial arrhythmia. Patient *** Not on calcium channel blocker secondary to LV dysfunction and not on beta-blocker secondary to underlying lung disease. LV dysfunction.  Echo February 2024 showed EF 35 to 40%, Grade I DD, septal dyssynergy due to LBBB.  Repeat limited echo May 2024 showed EF 45 to 50%, global hypokinesis, normal RV function.  Patient*** Euvolemic and well compensated on exam.  Continue spironolactone, losartan, Lasix, Farxiga.  Not on beta-blocker secondary to underlying lung disease. Hypertension. BP today *** Patient denies headaches, dizziness or vision changes. Continue losartan, spironolactone. Hyperlipidemia.***   ROS: All other systems reviewed and are otherwise negative except as noted in History of Present Illness.  Studies Reviewed       ***  Risk Assessment/Calculations    {Does this patient have ATRIAL FIBRILLATION?:[phone]} No BP recorded.  {Refresh Note OR Click here to enter BP  :1}***        Physical Exam    VS:  There were no vitals taken for this visit. , BMI There is no height or weight on file to calculate BMI.  GEN: Well nourished, well developed, in no acute distress. Neck: No JVD or carotid bruits. Cardiac: *** RRR. No murmurs. No rubs or gallops.   Respiratory:  Respirations regular and unlabored. Clear to auscultation without rales, wheezing or rhonchi. GI: Soft, nontender, nondistended. Extremities: Radials/DP/PT 2+ and equal bilaterally. No clubbing  or cyanosis. No edema ***  Skin: Warm and dry, no rash. Neuro: Strength intact.  Assessment & Plan   ***  Disposition: ***     {Are you ordering a CV Procedure (e.g. stress test, cath, DCCV, TEE, etc)?   Press F2        :[id number]}   Signed, Etta Grandchild. Parsa Rickett, DNP, NP-C

## 2023-08-25 ENCOUNTER — Encounter: Payer: Self-pay | Admitting: Student

## 2023-08-25 ENCOUNTER — Ambulatory Visit: Payer: Medicare PPO | Attending: Physician Assistant | Admitting: Student

## 2023-08-25 VITALS — BP 126/54 | HR 53 | Ht 61.0 in | Wt 164.0 lb

## 2023-08-25 DIAGNOSIS — I519 Heart disease, unspecified: Secondary | ICD-10-CM | POA: Diagnosis not present

## 2023-08-25 DIAGNOSIS — J849 Interstitial pulmonary disease, unspecified: Secondary | ICD-10-CM

## 2023-08-25 DIAGNOSIS — I10 Essential (primary) hypertension: Secondary | ICD-10-CM | POA: Diagnosis not present

## 2023-08-25 NOTE — Patient Instructions (Signed)
Medication Instructions:  NO CHANGES *If you need a refill on your cardiac medications before your next appointment, please call your pharmacy*   Lab Work: NO LABS If you have labs (blood work) drawn today and your tests are completely normal, you will receive your results only by: MyChart Message (if you have MyChart) OR A paper copy in the mail If you have any lab test that is abnormal or we need to change your treatment, we will call you to review the results.   Testing/Procedures: NO TESTING   Follow-Up: At [facility], you and your health needs are our priority.  As part of our continuing mission to provide you with exceptional heart care, we have created designated Provider Care Teams.  These Care Teams include your primary Cardiologist (physician) and Advanced Practice Providers (APPs -  Physician Assistants and Nurse Practitioners) who all work together to provide you with the care you need, when you need it.    Your next appointment:   6 month(s)  Provider:   Peter [place], MD

## 2023-08-27 ENCOUNTER — Other Ambulatory Visit: Payer: Self-pay | Admitting: Internal Medicine

## 2023-08-27 DIAGNOSIS — E041 Nontoxic single thyroid nodule: Secondary | ICD-10-CM

## 2023-09-08 ENCOUNTER — Ambulatory Visit
Admission: RE | Admit: 2023-09-08 | Discharge: 2023-09-08 | Disposition: A | Discharge status: Home / Self Care | Payer: Medicare PPO | Source: Ambulatory Visit | Attending: Internal Medicine | Admitting: Internal Medicine

## 2023-09-08 ENCOUNTER — Other Ambulatory Visit (HOSPITAL_COMMUNITY)
Admission: RE | Admit: 2023-09-08 | Discharge: 2023-09-08 | Disposition: A | Discharge status: Home / Self Care | Payer: Medicare PPO | Source: Ambulatory Visit | Attending: Interventional Radiology | Admitting: Interventional Radiology

## 2023-09-08 DIAGNOSIS — E041 Nontoxic single thyroid nodule: Secondary | ICD-10-CM | POA: Insufficient documentation

## 2023-09-10 ENCOUNTER — Other Ambulatory Visit: Payer: Self-pay | Admitting: Cardiovascular Disease

## 2023-09-10 LAB — CYTOLOGY - NON PAP

## 2023-09-14 DIAGNOSIS — J849 Interstitial pulmonary disease, unspecified: Secondary | ICD-10-CM | POA: Diagnosis not present

## 2023-09-14 DIAGNOSIS — I498 Other specified cardiac arrhythmias: Secondary | ICD-10-CM | POA: Diagnosis not present

## 2023-09-14 DIAGNOSIS — E119 Type 2 diabetes mellitus without complications: Secondary | ICD-10-CM | POA: Diagnosis not present

## 2023-09-14 DIAGNOSIS — E876 Hypokalemia: Secondary | ICD-10-CM | POA: Diagnosis not present

## 2023-09-25 DIAGNOSIS — L570 Actinic keratosis: Secondary | ICD-10-CM | POA: Diagnosis not present

## 2023-09-25 DIAGNOSIS — D1801 Hemangioma of skin and subcutaneous tissue: Secondary | ICD-10-CM | POA: Diagnosis not present

## 2023-09-25 DIAGNOSIS — L820 Inflamed seborrheic keratosis: Secondary | ICD-10-CM | POA: Diagnosis not present

## 2023-09-25 DIAGNOSIS — L821 Other seborrheic keratosis: Secondary | ICD-10-CM | POA: Diagnosis not present

## 2023-09-25 DIAGNOSIS — Z85828 Personal history of other malignant neoplasm of skin: Secondary | ICD-10-CM | POA: Diagnosis not present

## 2023-10-11 ENCOUNTER — Other Ambulatory Visit: Payer: Self-pay | Admitting: Physician Assistant

## 2023-10-12 DIAGNOSIS — M48061 Spinal stenosis, lumbar region without neurogenic claudication: Secondary | ICD-10-CM | POA: Diagnosis not present

## 2023-10-15 DIAGNOSIS — E119 Type 2 diabetes mellitus without complications: Secondary | ICD-10-CM | POA: Diagnosis not present

## 2023-10-15 DIAGNOSIS — I498 Other specified cardiac arrhythmias: Secondary | ICD-10-CM | POA: Diagnosis not present

## 2023-10-15 DIAGNOSIS — J849 Interstitial pulmonary disease, unspecified: Secondary | ICD-10-CM | POA: Diagnosis not present

## 2023-10-15 DIAGNOSIS — E876 Hypokalemia: Secondary | ICD-10-CM | POA: Diagnosis not present

## 2023-10-16 DIAGNOSIS — M48061 Spinal stenosis, lumbar region without neurogenic claudication: Secondary | ICD-10-CM | POA: Diagnosis not present

## 2023-10-20 DIAGNOSIS — M545 Low back pain, unspecified: Secondary | ICD-10-CM | POA: Diagnosis not present

## 2023-10-26 DIAGNOSIS — R42 Dizziness and giddiness: Secondary | ICD-10-CM | POA: Diagnosis not present

## 2023-10-26 DIAGNOSIS — M5431 Sciatica, right side: Secondary | ICD-10-CM | POA: Diagnosis not present

## 2023-11-09 DIAGNOSIS — M545 Low back pain, unspecified: Secondary | ICD-10-CM | POA: Diagnosis not present

## 2023-11-09 DIAGNOSIS — M25551 Pain in right hip: Secondary | ICD-10-CM | POA: Diagnosis not present

## 2023-11-12 ENCOUNTER — Other Ambulatory Visit: Payer: Self-pay | Admitting: Medical Genetics

## 2023-11-14 DIAGNOSIS — J849 Interstitial pulmonary disease, unspecified: Secondary | ICD-10-CM | POA: Diagnosis not present

## 2023-11-14 DIAGNOSIS — I498 Other specified cardiac arrhythmias: Secondary | ICD-10-CM | POA: Diagnosis not present

## 2023-11-14 DIAGNOSIS — E119 Type 2 diabetes mellitus without complications: Secondary | ICD-10-CM | POA: Diagnosis not present

## 2023-11-14 DIAGNOSIS — E876 Hypokalemia: Secondary | ICD-10-CM | POA: Diagnosis not present

## 2023-11-20 DIAGNOSIS — M5416 Radiculopathy, lumbar region: Secondary | ICD-10-CM | POA: Diagnosis not present

## 2023-12-10 IMAGING — MG MM DIGITAL SCREENING BILAT W/ TOMO AND CAD
8 series · 8 of 24 positions shown · non-contrast
Comparison: Previous exam(s).

CLINICAL DATA: Screening.

EXAM:
DIGITAL SCREENING BILATERAL MAMMOGRAM WITH TOMOSYNTHESIS AND CAD
TECHNIQUE: Bilateral screening digital craniocaudal and mediolateral oblique
mammograms were obtained. Bilateral screening digital breast
tomosynthesis was performed. The images were evaluated with
computer-aided detection.

[L CC synth-2D]
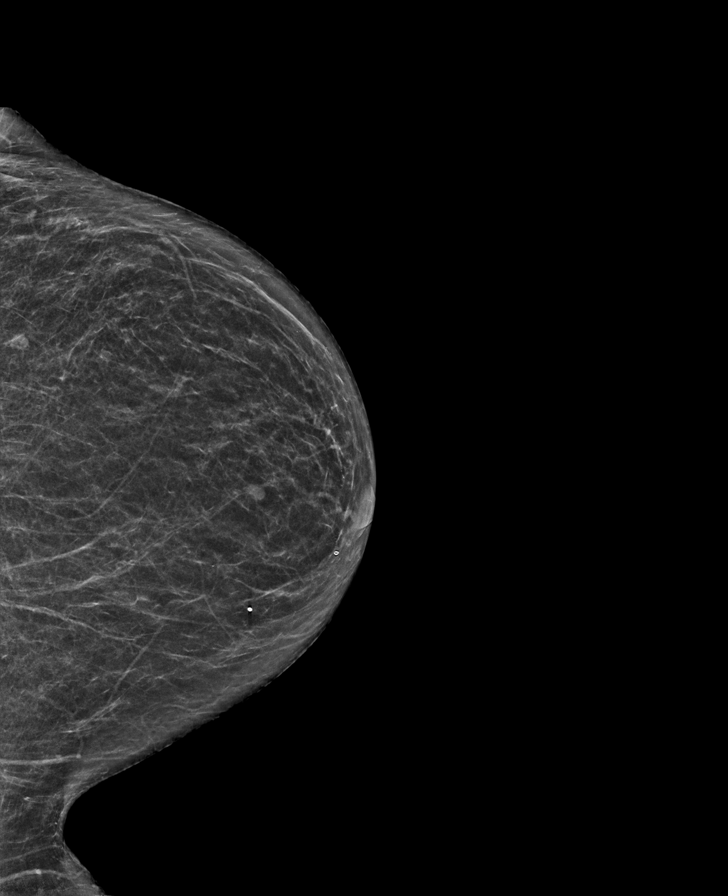

[R MLO synth-2D]
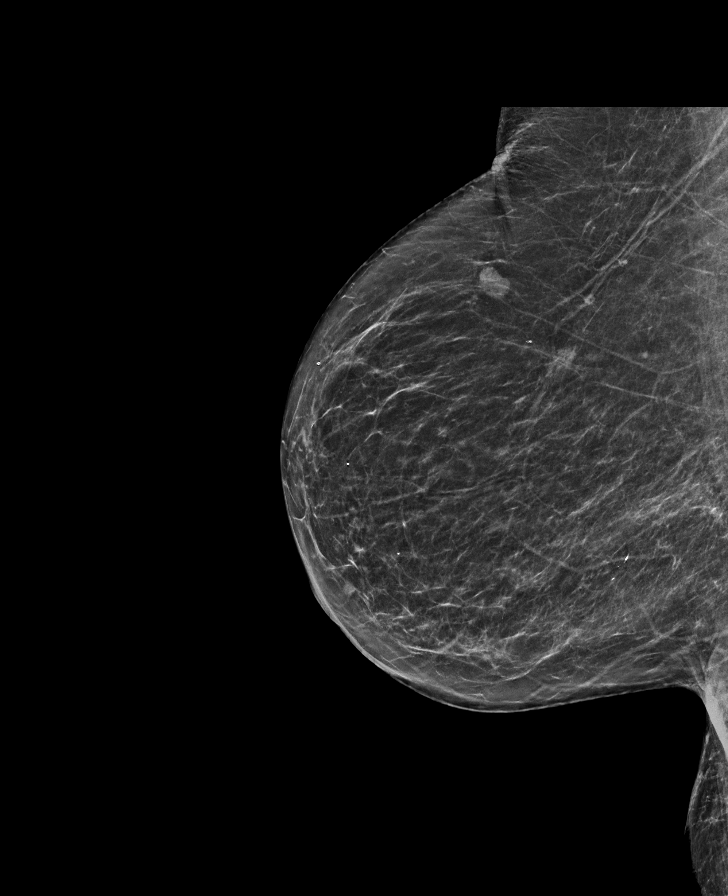

[L MLO synth-2D]
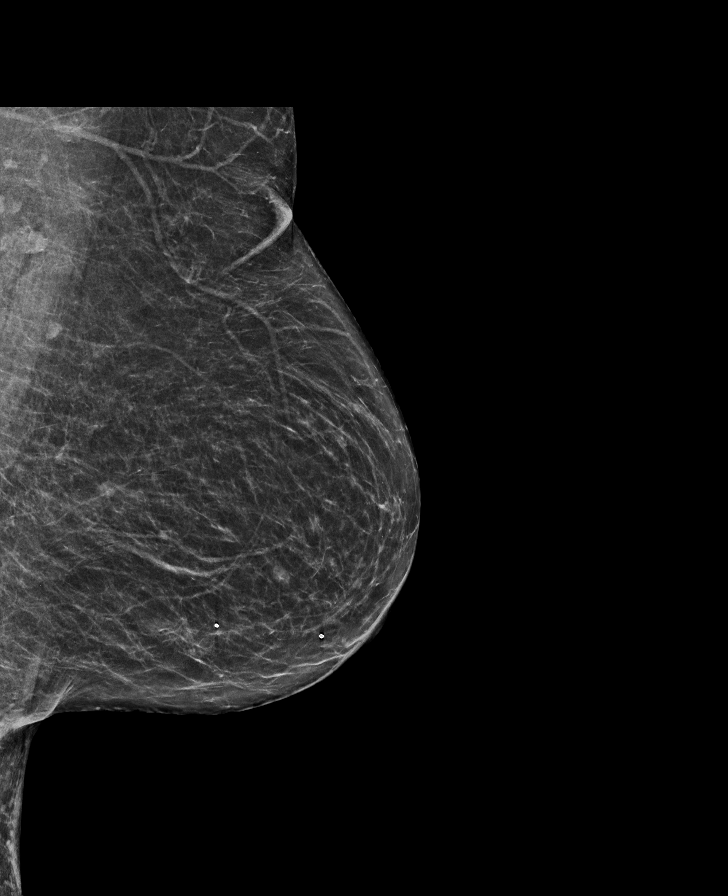

[R CC synth-2D]
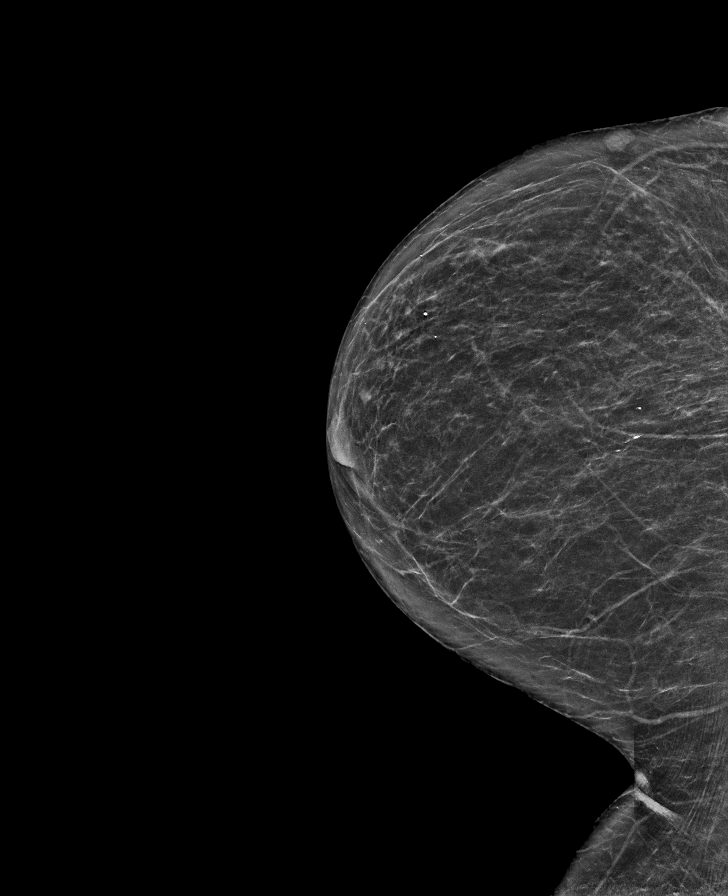

[L MLO tomo · tomo slice 32/63.0]
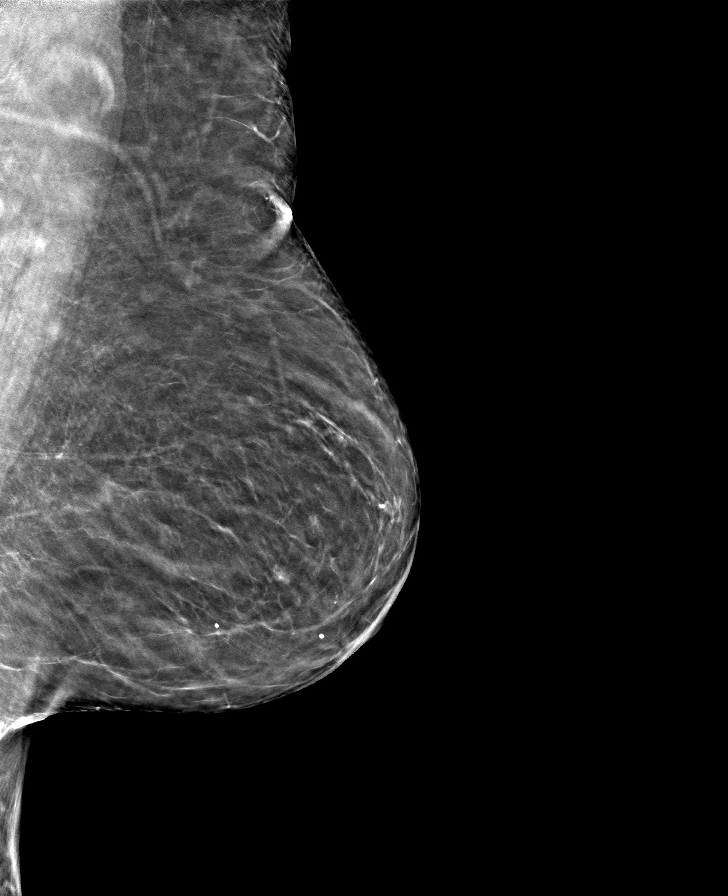

[R CC tomo · tomo slice 31/60.0]
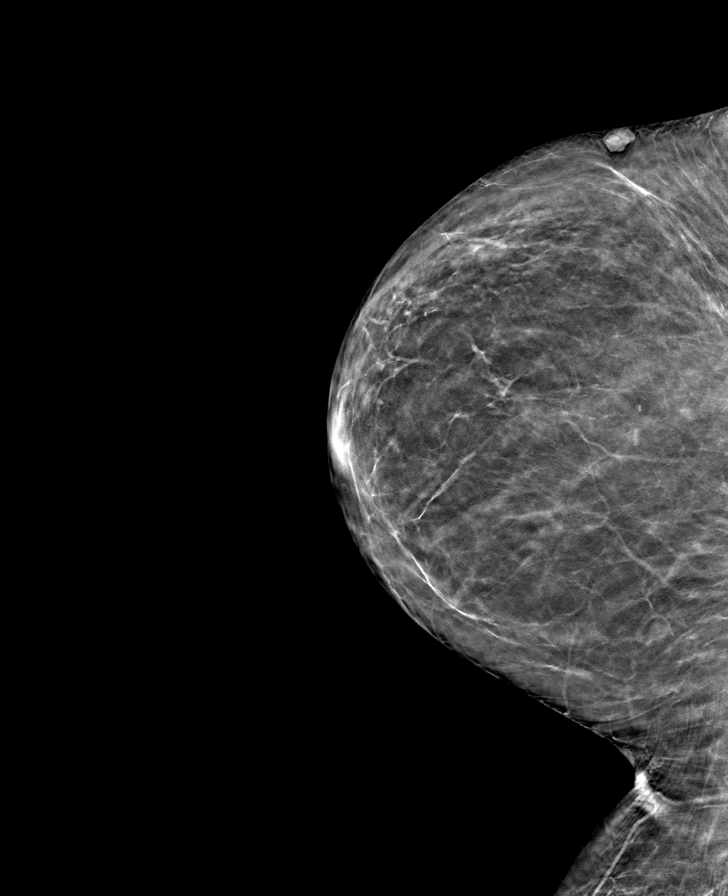

[L CC tomo · tomo slice 27/54.0]
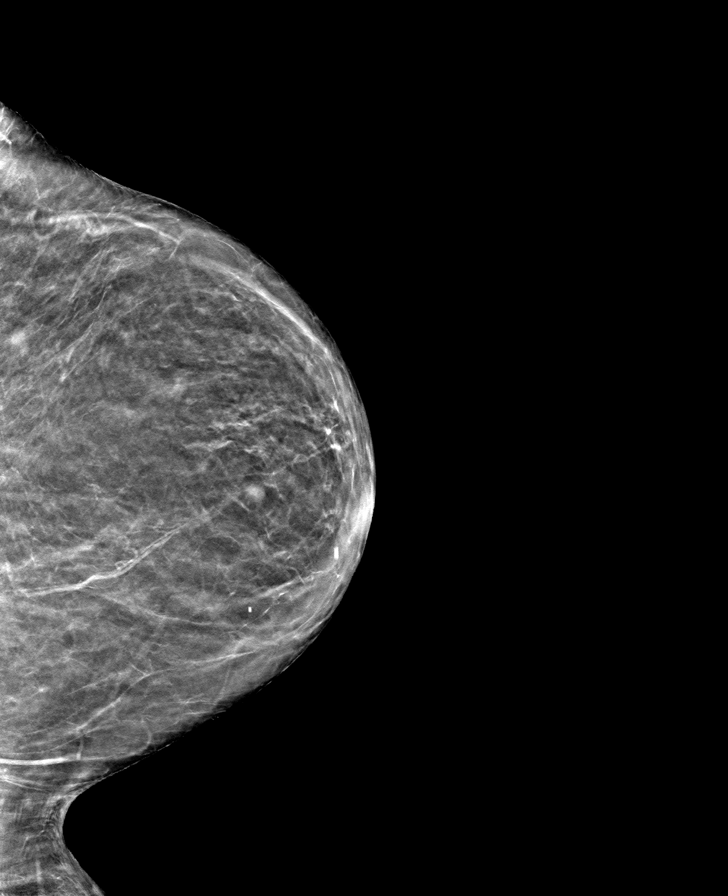

[R MLO tomo · tomo slice 33/64.0]
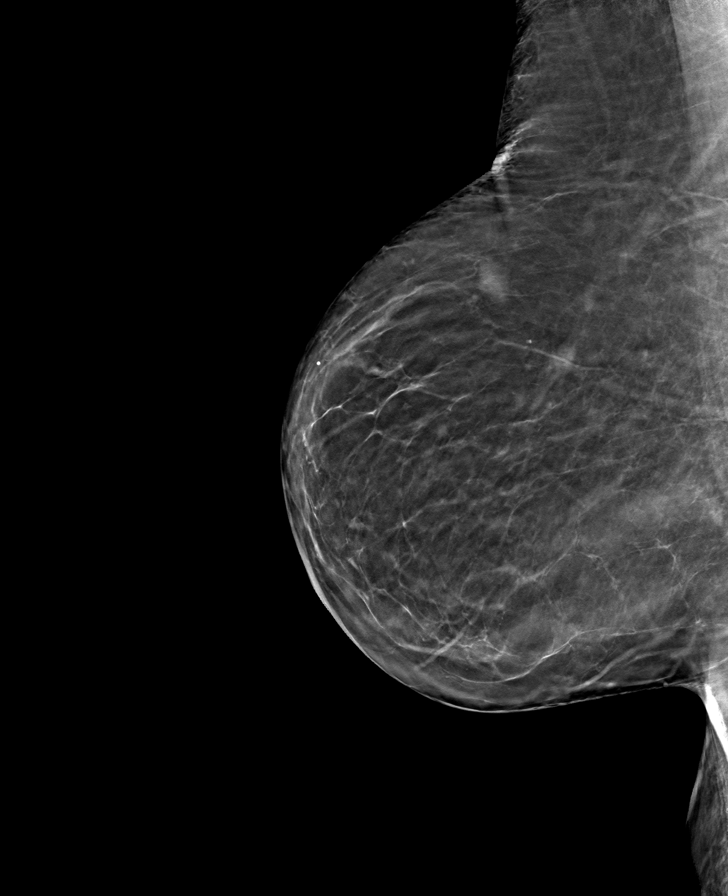

[8 of 24 positions shown; findings below may reference images not displayed]

ACR Breast Density Category b: There are scattered areas of
fibroglandular density.
FINDINGS: There are no findings suspicious for malignancy.
IMPRESSION: No mammographic evidence of malignancy. A result letter of this
screening mammogram will be mailed directly to the patient.

RECOMMENDATION:
Screening mammogram in one year. (Code:51-O-LD2)

BI-RADS CATEGORY  1: Negative.

## 2023-12-15 DIAGNOSIS — E876 Hypokalemia: Secondary | ICD-10-CM | POA: Diagnosis not present

## 2023-12-15 DIAGNOSIS — I498 Other specified cardiac arrhythmias: Secondary | ICD-10-CM | POA: Diagnosis not present

## 2023-12-15 DIAGNOSIS — J849 Interstitial pulmonary disease, unspecified: Secondary | ICD-10-CM | POA: Diagnosis not present

## 2023-12-15 DIAGNOSIS — E119 Type 2 diabetes mellitus without complications: Secondary | ICD-10-CM | POA: Diagnosis not present

## 2023-12-16 DIAGNOSIS — M5416 Radiculopathy, lumbar region: Secondary | ICD-10-CM | POA: Diagnosis not present

## 2023-12-23 DIAGNOSIS — M170 Bilateral primary osteoarthritis of knee: Secondary | ICD-10-CM | POA: Diagnosis not present

## 2023-12-28 DIAGNOSIS — N1831 Chronic kidney disease, stage 3a: Secondary | ICD-10-CM | POA: Diagnosis not present

## 2023-12-28 DIAGNOSIS — I700 Atherosclerosis of aorta: Secondary | ICD-10-CM | POA: Diagnosis not present

## 2023-12-28 DIAGNOSIS — I4719 Other supraventricular tachycardia: Secondary | ICD-10-CM | POA: Diagnosis not present

## 2023-12-28 DIAGNOSIS — D649 Anemia, unspecified: Secondary | ICD-10-CM | POA: Diagnosis not present

## 2023-12-28 DIAGNOSIS — F325 Major depressive disorder, single episode, in full remission: Secondary | ICD-10-CM | POA: Diagnosis not present

## 2023-12-28 DIAGNOSIS — I10 Essential (primary) hypertension: Secondary | ICD-10-CM | POA: Diagnosis not present

## 2023-12-28 DIAGNOSIS — H409 Unspecified glaucoma: Secondary | ICD-10-CM | POA: Diagnosis not present

## 2023-12-28 DIAGNOSIS — Z0000 Encounter for general adult medical examination without abnormal findings: Secondary | ICD-10-CM | POA: Diagnosis not present

## 2023-12-28 DIAGNOSIS — Z1331 Encounter for screening for depression: Secondary | ICD-10-CM | POA: Diagnosis not present

## 2023-12-28 DIAGNOSIS — E1169 Type 2 diabetes mellitus with other specified complication: Secondary | ICD-10-CM | POA: Diagnosis not present

## 2023-12-28 DIAGNOSIS — J84112 Idiopathic pulmonary fibrosis: Secondary | ICD-10-CM | POA: Diagnosis not present

## 2023-12-28 DIAGNOSIS — M8580 Other specified disorders of bone density and structure, unspecified site: Secondary | ICD-10-CM | POA: Diagnosis not present

## 2023-12-28 DIAGNOSIS — R768 Other specified abnormal immunological findings in serum: Secondary | ICD-10-CM | POA: Diagnosis not present

## 2023-12-28 DIAGNOSIS — E7800 Pure hypercholesterolemia, unspecified: Secondary | ICD-10-CM | POA: Diagnosis not present

## 2023-12-29 ENCOUNTER — Other Ambulatory Visit: Payer: Self-pay | Admitting: Physician Assistant

## 2023-12-29 DIAGNOSIS — M810 Age-related osteoporosis without current pathological fracture: Secondary | ICD-10-CM

## 2023-12-30 DIAGNOSIS — M170 Bilateral primary osteoarthritis of knee: Secondary | ICD-10-CM | POA: Diagnosis not present

## 2024-01-06 DIAGNOSIS — M170 Bilateral primary osteoarthritis of knee: Secondary | ICD-10-CM | POA: Diagnosis not present

## 2024-01-15 DIAGNOSIS — E119 Type 2 diabetes mellitus without complications: Secondary | ICD-10-CM | POA: Diagnosis not present

## 2024-01-15 DIAGNOSIS — E876 Hypokalemia: Secondary | ICD-10-CM | POA: Diagnosis not present

## 2024-01-15 DIAGNOSIS — J849 Interstitial pulmonary disease, unspecified: Secondary | ICD-10-CM | POA: Diagnosis not present

## 2024-01-15 DIAGNOSIS — I498 Other specified cardiac arrhythmias: Secondary | ICD-10-CM | POA: Diagnosis not present

## 2024-01-27 ENCOUNTER — Ambulatory Visit: Payer: Medicare PPO | Admitting: Pulmonary Disease

## 2024-01-29 DIAGNOSIS — D649 Anemia, unspecified: Secondary | ICD-10-CM | POA: Diagnosis not present

## 2024-01-29 DIAGNOSIS — E538 Deficiency of other specified B group vitamins: Secondary | ICD-10-CM | POA: Diagnosis not present

## 2024-02-12 ENCOUNTER — Telehealth: Payer: Self-pay | Admitting: Genetic Counselor

## 2024-02-12 DIAGNOSIS — E876 Hypokalemia: Secondary | ICD-10-CM | POA: Diagnosis not present

## 2024-02-12 DIAGNOSIS — J849 Interstitial pulmonary disease, unspecified: Secondary | ICD-10-CM | POA: Diagnosis not present

## 2024-02-12 DIAGNOSIS — I498 Other specified cardiac arrhythmias: Secondary | ICD-10-CM | POA: Diagnosis not present

## 2024-02-12 DIAGNOSIS — E119 Type 2 diabetes mellitus without complications: Secondary | ICD-10-CM | POA: Diagnosis not present

## 2024-02-12 NOTE — Progress Notes (Signed)
 Spoke with Mrs. Marro regarding her enrollment in the [employer].  Ms. Winders did not remember enrolling in the program. We discussed that the [employer] offers genetic testing for Hereditary Breast and Ovarian Cancer, Lynch Syndrome, and Familial Hypercholesterolemia.  After discussion, Ms. Wandrey requested to withdraw from the study due to recent personal health struggles. Mrs. Spelman was informed that The future [employer] order has been cancelled and research participant status updated to "withdrawn".   Tilda Franco, MS [facility] Certified Genetic Counselor

## 2024-02-22 ENCOUNTER — Other Ambulatory Visit: Payer: Self-pay | Admitting: Physician Assistant

## 2024-02-22 DIAGNOSIS — Z0000 Encounter for general adult medical examination without abnormal findings: Secondary | ICD-10-CM

## 2024-02-28 NOTE — Progress Notes (Unsigned)
 Cardiology Office Note:    Date:  02/28/2024   ID:  Cheryl Quinn, DOB 1932-12-18, MRN [id number]  PCP:  Thana Ates, MD    HeartCare Providers Cardiologist:  Timmie Dugue [place], MD { Pulmonologist: Dr. Isaiah Serge  Referring MD: Thana Ates, MD   No chief complaint on file.   History of Present Illness:    Cheryl Quinn is a 89 y.o. female with a hx of hypertension, hyperlipidemia, DM2 and history of pulmonary fibrosis.  Patient was recently admitted in late January 2024 with shortness of breath.  She was placed on supplemental oxygen due to hypoxia.  Chest x-ray showed ill-defined opacity within the bilateral lung bases which may reflect atelectasis or pneumonia, 6 mm nodular opacity in the right lung base.  Cardiology service consulted for tachycardia.  Telemetry revealed mostly sinus tachycardia with PACs, suspect the patient has MAT given underlying pulmonary disease.  She also had new left bundle branch block since 2016.  She was placed on Cardizem.  TSH was normal.  Echocardiogram came back showing EF 40%, dyssynergy related to left bundle branch block.  Cardizem was discontinued given LV dysfunction.  Patient was not placed on beta-blocker either due to underlying lung disease.  She was started on goal-directed medical therapy including losartan, spironolactone and Farxiga.  She was seen in follow up in Feb by Azalee [facility]. It was noted her LBBB resolved. She was on Farxiga, losartan and spironolactone. She was resumed on lasix for edema. She had a cardiac PET CT that was low risk with no ischemia and EF 46% at rest and 51% with stress.   On follow up today she is seen with her husband. She states her breathing is doing well. Infrequent cough. Still using oxygen. Pulse ox at home shows HR typically in 60s. Denies any significant palpitations. Swelling in ankles is stable. Weight has actually dropped 4 lbs. Tolerating medication well. Repeat Echo done yesterday shows EF  improved to 40-45%.   Past Medical History:  Diagnosis Date   Arthritis    hips,shoulders   Asthma    Complete rupture of left rotator cuff 09/28/2015   Diabetes mellitus without complication (HCC)    GERD (gastroesophageal reflux disease)    with spicy foods   Glaucoma    Hyperlipidemia    Hypertension    PONV (postoperative nausea and vomiting)    Rotator cuff tear     Past Surgical History:  Procedure Laterality Date   ABDOMINAL HYSTERECTOMY     BLEPHAROPLASTY     BREAST EXCISIONAL BIOPSY Right 2003   Benign   BUNIONECTOMY WITH HAMMERTOE RECONSTRUCTION Bilateral    CHOLECYSTECTOMY     EYE SURGERY     cataract   SHOULDER ARTHROSCOPY WITH ROTATOR CUFF REPAIR Left 09/28/2015   Procedure: LEFT SHOULDER ARTHROSCOPY DEBRIDEMENT, ROTATOR CUFF REPAIR;  Surgeon: Teryl Lucy, MD;  Location: [facility];  Service: Orthopedics;  Laterality: Left;   TRIGGER FINGER RELEASE Left    WRIST FRACTURE SURGERY Right     Current Medications: No outpatient medications have been marked as taking for the 03/04/24 encounter (Appointment) with [place], Alora Gorey M, MD.     Allergies:   Oxycodone, Duricef [cefadroxil], Lipitor [atorvastatin], Lisinopril, and Vicodin [hydrocodone-acetaminophen]   Social History   Socioeconomic History   Marital status: Married    Spouse name: Not on file   Number of children: Not on file   Years of education: Not on file   Highest education  level: Not on file  Occupational History   Not on file  Tobacco Use   Smoking status: Former    Current packs/day: 0.00    Types: Cigarettes    Quit date: 29    Years since quitting: 27.2    Passive exposure: Never   Smokeless tobacco: Never   Tobacco comments:    Smoked only 1-2 cigarettes per day  Vaping Use   Vaping status: Never Used  Substance and Sexual Activity   Alcohol use: Yes    Comment: social   Drug use: No   Sexual activity: Not on file  Other Topics Concern   Not on file  Social  History Narrative   Not on file   Social Drivers of Health   Financial Resource Strain: Not on file  Food Insecurity: Unknown (11/26/2022)   Received from [employer], [facility] [employer] Insecurity    In the past 3 months, have you had to go without food for 24 hours, multiple times due to lack of resources?: Not on file  Transportation Needs: Unknown (11/26/2022)   Received from [employer], [facility]   Transportation Needs    In the past 3 months, has lack of transporation kept you from medical appointments or getting things you need that are essential to your health?: Not on file  Physical Activity: Not on file  Stress: Not on file  Social Connections: Unknown (01/15/2023)   Received from [employer], [facility]   Social Connections    In the past 3 months, do you feel that you lack companionship or social support?: Not on file     Family History: The patient's family history includes COPD in her sister.  ROS:   Please see the history of present illness.     All other systems reviewed and are negative.  EKGs/Labs/Other Studies Reviewed:    The following studies were reviewed today:  Echo 12/25/2022 1. Left ventricular ejection fraction, by estimation, is 35 to 40%. The  left ventricle has moderately decreased function. The left ventricle has  no regional wall motion abnormalities. Left ventricular diastolic  parameters are consistent with Grade I  diastolic dysfunction (impaired relaxation). Septal dyssynergy due to  LBBB.   2. Right ventricular systolic function is normal. The right ventricular  size is normal.   3. Left atrial size was moderately dilated.   4. The mitral valve is normal in structure. Trivial mitral valve  regurgitation. No evidence of mitral stenosis.   5. The aortic valve is calcified. There is mild calcification of the  aortic valve. Aortic valve regurgitation is mild. No aortic stenosis is  present.   6. The inferior vena cava is  normal in size with greater than 50%  respiratory variability, suggesting right atrial pressure of 3 mmHg.   Cardiac PET CT 02/25/23: Study Result  Narrative & Impression      LV perfusion is normal. There is no evidence of ischemia. There is no evidence of infarction.   Rest left ventricular function is normal. Rest EF: 46 %. Stress left ventricular function is normal. Stress EF: 51 %. End diastolic cavity size is normal. End systolic cavity size is normal.   Myocardial blood flow was computed to be 0.83ml/g/min at rest and 1.68ml/g/min at stress. Global myocardial blood flow reserve was 2.16 and was normal.   Coronary calcium was absent on the attenuation correction CT images.   The study is normal. The study is low risk.   Electronically  signed by Lennie Odor, MD   Echo 04/15/23: IMPRESSIONS     1. Left ventricular ejection fraction, by estimation, is 45 to 50%. The  left ventricle has mildly decreased function. The left ventricle  demonstrates global hypokinesis.   2. Right ventricular systolic function is normal. The right ventricular  size is normal. There is normal pulmonary artery systolic pressure. The  estimated right ventricular systolic pressure is 34.1 mmHg.   3. The mitral valve is normal in structure. No evidence of mitral valve  regurgitation. No evidence of mitral stenosis.   4. The aortic valve is tricuspid. Aortic valve regurgitation is trivial.  No aortic stenosis is present.   5. The inferior vena cava is normal in size with greater than 50%  respiratory variability, suggesting right atrial pressure of 3 mmHg.    EKG:  EKG is not ordered today.    Recent Labs: No results found for requested labs within last 365 days.  Recent Lipid Panel No results found for: "CHOL", "TRIG", "HDL", "CHOLHDL", "VLDL", "LDLCALC", "LDLDIRECT"  Dated 03/25/23: A1c 6.1% Dated 12/28/23: A1c 6.2%. Cholesterol 137, triglycerides 140, HDL 65, LDL 48. LFTs normal   Risk  Assessment/Calculations:           Physical Exam:    VS:  There were no vitals taken for this visit.    No BP recorded.  {Refresh Note OR Click here to enter BP  :1}***   Wt Readings from Last 3 Encounters:  08/25/23 164 lb (74.4 kg)  07/08/23 158 lb 12.8 oz (72 kg)  04/17/23 160 lb 9.6 oz (72.8 kg)     GEN:  Well nourished, well developed in no acute distress HEENT: Normal NECK: No JVD; No carotid bruits LYMPHATICS: No lymphadenopathy CARDIAC: RRR, no murmurs, rubs, gallops RESPIRATORY:  Clear to auscultation without rales, wheezing or rhonchi  ABDOMEN: Soft, non-tender, non-distended MUSCULOSKELETAL:  No edema; No deformity  SKIN: Warm and dry NEUROLOGIC:  Alert and oriented x 3 PSYCHIATRIC:  Normal affect   ASSESSMENT:    No diagnosis found.  PLAN:    In order of problems listed above:  LV dysfunction: EF 35-40% on echocardiogram 12/25/2022.  Currently on losartan,  spironolactone, and [place].  Patient does have 1+ ankle edema. Will continue current therapy. EF improved to 40-45% by Echo. 46% by PET CT.  No evidence of ischemia on stress testing.   Chest discomfort: Case discussed with Dr. Flora Lipps, she is on 2 to 3 L of oxygen at rest and 4 L with stress.  She does not have any active wheezing on physical exam.  She has been describing bandlike tightness across the lower chest below her breasts.  Will proceed with PET stress test especially given the recent LV dysfunction.  Hypertension: Blood pressure controlled.  Hyperlipidemia: On Crestor  DM2: Managed by primary care provider  6.   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 Improved.    Will follow up in 6 months    Signed, Mourad Cwikla [place] MD, [facility]  02/28/2024 11:50 AM    [facility]

## 2024-03-03 ENCOUNTER — Telehealth: Payer: Self-pay | Admitting: Pulmonary Disease

## 2024-03-03 NOTE — Telephone Encounter (Signed)
(  Dr. Isaiah Serge is her Dr. Not Dr. Vassie Loll)  PT is stating she gets a stuffed up nose at night. She has an appt w/Dr. Isaiah Serge in May but is concerned because she wakes up panicked . She uses a adjustable bed but does not know what else may help.  Please call to advise @ [phone]

## 2024-03-03 NOTE — Telephone Encounter (Signed)
 I called and discussed with patient and husband Pt complaints of stuffy nose that is making it hard to breath. Suspect that oxygen is drying out her nasal secretions She has a humidifier with oxygen at home that she will start using Advised to use over-the-counter nasal spray, Nettie pot and Nasonex  We will reassess at her scheduled return visit.  Nothing further needed.

## 2024-03-03 NOTE — Telephone Encounter (Signed)
 Called and spoke to patient.  She stated that she is experiencing nasal congestion and SOB during sleep.  She wears 2L at bedtime and 2-3L with exertion.  She is not using Symbicort or albuterol.  Denied f/c/s or additional sx.   Dr. Isaiah Serge, please advise. Thanks

## 2024-03-04 ENCOUNTER — Ambulatory Visit: Payer: Medicare PPO | Attending: Cardiology | Admitting: Cardiology

## 2024-03-04 ENCOUNTER — Encounter: Payer: Self-pay | Admitting: Cardiology

## 2024-03-04 VITALS — BP 102/60 | HR 99 | Ht 61.0 in | Wt 157.0 lb

## 2024-03-04 DIAGNOSIS — I5022 Chronic systolic (congestive) heart failure: Secondary | ICD-10-CM

## 2024-03-04 DIAGNOSIS — J8410 Pulmonary fibrosis, unspecified: Secondary | ICD-10-CM

## 2024-03-04 DIAGNOSIS — I10 Essential (primary) hypertension: Secondary | ICD-10-CM

## 2024-03-04 MED ORDER — SACUBITRIL-VALSARTAN 49-51 MG PO TABS: 1.0000 | ORAL_TABLET | Freq: Two times a day (BID) | ORAL | 3 refills | Status: DC

## 2024-03-04 NOTE — Patient Instructions (Signed)
 Medication Instructions:  Stop Losartan Start Entresto 49/51 mg twice a day Continue all other medications *If you need a refill on your cardiac medications before your next appointment, please call your pharmacy*  Lab Work: None ordered  Testing/Procedures: None ordered  Follow-Up: At [facility], you and your health needs are our priority.  As part of our continuing mission to provide you with exceptional heart care, our providers are all part of one team.  This team includes your primary Cardiologist (physician) and Advanced Practice Providers or APPs (Physician Assistants and Nurse Practitioners) who all work together to provide you with the care you need, when you need it.  Your next appointment:  6 months    Call in July to schedule Oct appointment     Provider:  Dr.Jordan   We recommend signing up for the patient portal called "MyChart".  Sign up information is provided on this After Visit Summary.  MyChart is used to connect with patients for Virtual Visits (Telemedicine).  Patients are able to view lab/test results, encounter notes, upcoming appointments, etc.  Non-urgent messages can be sent to your provider as well.   To learn more about what you can do with MyChart, go to [email or web address].         1st Floor: - Lobby - Registration  - Pharmacy  - Lab - Cafe  2nd Floor: - PV Lab - Diagnostic Testing (echo, CT, nuclear med)  3rd Floor: - Vacant  4th Floor: - TCTS (cardiothoracic surgery) - AFib Clinic - Structural Heart Clinic - Vascular Surgery  - Vascular Ultrasound  5th Floor: - HeartCare Cardiology (general and EP) - Clinical Pharmacy for coumadin, hypertension, lipid, weight-loss medications, and med management appointments    Valet parking services will be available as well.

## 2024-03-16 ENCOUNTER — Ambulatory Visit: Payer: Medicare PPO | Admitting: Pulmonary Disease

## 2024-03-21 ENCOUNTER — Encounter (HOSPITAL_BASED_OUTPATIENT_CLINIC_OR_DEPARTMENT_OTHER)

## 2024-03-22 ENCOUNTER — Ambulatory Visit
Admission: RE | Admit: 2024-03-22 | Discharge: 2024-03-22 | Disposition: A | Discharge status: Home / Self Care | Source: Ambulatory Visit | Attending: Physician Assistant

## 2024-03-22 DIAGNOSIS — Z0000 Encounter for general adult medical examination without abnormal findings: Secondary | ICD-10-CM

## 2024-03-24 ENCOUNTER — Other Ambulatory Visit: Payer: Self-pay | Admitting: Physician Assistant

## 2024-03-24 DIAGNOSIS — N63 Unspecified lump in unspecified breast: Secondary | ICD-10-CM

## 2024-03-29 ENCOUNTER — Ambulatory Visit (HOSPITAL_BASED_OUTPATIENT_CLINIC_OR_DEPARTMENT_OTHER): Admitting: Pulmonary Disease

## 2024-03-29 DIAGNOSIS — J849 Interstitial pulmonary disease, unspecified: Secondary | ICD-10-CM

## 2024-03-29 LAB — PULMONARY FUNCTION TEST
DL/VA % pred: 76 %
DL/VA: 3.14 ml/min/mmHg/L
DLCO unc % pred: 60 %
DLCO unc: 10.15 ml/min/mmHg
FEF 25-75 Post: 3.05 L/s
FEF 25-75 Pre: 2.45 L/s
FEF2575-%Change-Post: 24 %
FEF2575-%Pred-Post: 359 %
FEF2575-%Pred-Pre: 289 %
FEV1-%Change-Post: 6 %
FEV1-%Pred-Post: 147 %
FEV1-%Pred-Pre: 138 %
FEV1-Post: 2.09 L
FEV1-Pre: 1.97 L
FEV1FVC-%Change-Post: 2 %
FEV1FVC-%Pred-Pre: 119 %
FEV6-%Change-Post: 3 %
FEV6-%Pred-Post: 131 %
FEV6-%Pred-Pre: 126 %
FEV6-Post: 2.37 L
FEV6-Pre: 2.29 L
FEV6FVC-%Pred-Post: 107 %
FEV6FVC-%Pred-Pre: 107 %
FVC-%Change-Post: 3 %
FVC-%Pred-Post: 121 %
FVC-%Pred-Pre: 117 %
FVC-Post: 2.37 L
FVC-Pre: 2.29 L
Post FEV1/FVC ratio: 88 %
Post FEV6/FVC ratio: 100 %
Pre FEV1/FVC ratio: 86 %
Pre FEV6/FVC Ratio: 100 %
RV % pred: 89 %
RV: 2.17 L
TLC % pred: 97 %
TLC: 4.56 L

## 2024-03-29 NOTE — Patient Instructions (Signed)
 Full pft performed today.

## 2024-03-29 NOTE — Progress Notes (Signed)
 Full pft performed today.

## 2024-03-31 ENCOUNTER — Other Ambulatory Visit: Payer: Self-pay | Admitting: Physician Assistant

## 2024-04-04 ENCOUNTER — Encounter: Payer: Self-pay | Admitting: Physician Assistant

## 2024-04-04 ENCOUNTER — Ambulatory Visit
Admission: RE | Admit: 2024-04-04 | Discharge: 2024-04-04 | Disposition: A | Discharge status: Home / Self Care | Source: Ambulatory Visit | Attending: Physician Assistant | Admitting: Physician Assistant

## 2024-04-04 ENCOUNTER — Other Ambulatory Visit: Payer: Self-pay | Admitting: Physician Assistant

## 2024-04-04 DIAGNOSIS — N6311 Unspecified lump in the right breast, upper outer quadrant: Secondary | ICD-10-CM | POA: Diagnosis not present

## 2024-04-04 DIAGNOSIS — N63 Unspecified lump in unspecified breast: Secondary | ICD-10-CM

## 2024-04-04 DIAGNOSIS — N6310 Unspecified lump in the right breast, unspecified quadrant: Secondary | ICD-10-CM

## 2024-04-07 ENCOUNTER — Ambulatory Visit: Admitting: Pulmonary Disease

## 2024-04-07 ENCOUNTER — Encounter: Payer: Self-pay | Admitting: Pulmonary Disease

## 2024-04-07 ENCOUNTER — Ambulatory Visit (INDEPENDENT_AMBULATORY_CARE_PROVIDER_SITE_OTHER)

## 2024-04-07 VITALS — BP 103/64 | HR 74 | Temp 98.0°F | Ht 61.0 in | Wt 161.0 lb

## 2024-04-07 DIAGNOSIS — J849 Interstitial pulmonary disease, unspecified: Secondary | ICD-10-CM

## 2024-04-07 DIAGNOSIS — J45909 Unspecified asthma, uncomplicated: Secondary | ICD-10-CM

## 2024-04-07 DIAGNOSIS — R059 Cough, unspecified: Secondary | ICD-10-CM | POA: Diagnosis not present

## 2024-04-07 DIAGNOSIS — Z87891 Personal history of nicotine dependence: Secondary | ICD-10-CM

## 2024-04-07 MED ORDER — PREDNISONE 10 MG PO TABS: 40.0000 mg | ORAL_TABLET | Freq: Every day | ORAL | 0 refills | Status: AC

## 2024-04-07 MED ORDER — AZITHROMYCIN 250 MG PO TABS: ORAL_TABLET | ORAL | 0 refills | Status: AC

## 2024-04-07 NOTE — Patient Instructions (Signed)
 VISIT SUMMARY:  Today, you were seen for a productive cough and suspected bronchitis. We discussed your ongoing interstitial lung disease, a breast mass with an upcoming biopsy, a thyroid  nodule, and your history of sciatica. We also reviewed your goals of care, focusing on maintaining your quality of life.  YOUR PLAN:  -ACUTE BRONCHITIS OR PNEUMONIA: You have a productive cough with thick, yellow sputum, which may indicate bronchitis or pneumonia. We will order a chest x-ray to check for pneumonia and have prescribed antibiotics and prednisone  to help manage the infection and inflammation.  -INTERSTITIAL LUNG DISEASE: Your chronic lung condition has shown some progression. We will order a CT scan in 3-4 months to monitor the scarring and recommend pulmonary rehabilitation to help improve your exercise capacity and manage symptoms.  -BREAST MASS: A mass was found in your breast, and a biopsy is scheduled for next Tuesday. Depending on the results, we will discuss the best treatment options, keeping in mind your preference to avoid aggressive treatments.  -THYROID  NODULE: A thyroid  nodule was previously biopsied and showed nonspecific findings. No further treatment is needed at this time.  -SCIATICA: Your sciatica has significantly improved after three epidural injections, and you no longer need other medications for it.  INSTRUCTIONS:  Please follow up with the chest x-ray as soon as possible. Continue with the scheduled breast biopsy next Tuesday. We will plan for a CT scan in 3-4 months to monitor your lung condition. Consider starting pulmonary rehabilitation to help with your lung disease. If you have any new or worsening symptoms, please contact our office.

## 2024-04-07 NOTE — Progress Notes (Signed)
 Cheryl Quinn    [id number]    [birth year]  Primary Care Physician:Clelland, Fredick Jarred, PA  Referring Physician: Tena Feeling, MD [address] [place],  [place] [address]  Chief complaint: Follow-up for interstitial lung disease  HPI: 89 y.o. who  has a past medical history of Arthritis, Asthma, Complete rupture of left rotator cuff (09/28/2015), Diabetes mellitus without complication (HCC), GERD (gastroesophageal reflux disease), Glaucoma, Hyperlipidemia, Hypertension, PONV (postoperative nausea and vomiting), and Rotator cuff tear.   Referred here for evaluation of interstitial lung disease.  She had cough and shortness of breath which is worsened after she developed pneumonia seen outpatient earlier this year.  She had several chest x-rays and at least 2 rounds of antibiotics with some improvement but continues to have mild persistent symptoms.  She has history of recurrent bronchitis and asthma but not on controller medication.  She is using albuterol  as needed.  History also notable for seasonal allergies and occasional GERD symptoms  She has seen Dr. Rodell Citrin from Rheumatology in November 2023 for elevated rheumatoid factor.  He feels there is no evidence of rheumatoid arthritis but she has advanced osteoarthritis.  She was given a trial of prednisone  in December 2023 for presumptive diagnosis of HP as she had a exposure to down She feels that the prednisone  actually make her breathing worse and sugars are elevated Reviewed her case at multidisciplinary conference in January 2024 and findings were felt to be more consistent with IPF  In December 2023 after informed decision making we have decided to try Esbriet  but after review of side effects they are having second thoughts on this medication and did not want to start it  Hospitalized in February 2023 with multifocal atrial tachycardia, acute hypoxic respiratory failure secondary to ILD and has been started on  supplemental oxygen  at 2 L.  Pets: No pets Occupation: Retired Occupational psychologist Exposures: Has down pillows.  No mold, hot tub, Jacuzzi. ILD questionnaire 07/21/2022-negative except for above Smoking history: Smoked 2 to 3 cigarettes a day for 45 years.  Quit in 2000 Travel history: No significant travel history Relevant family history: Sister has COPD  Interim history: Discussed the use of AI scribe software for clinical note transcription with the patient, who gave verbal consent to proceed.  History of Present Illness Cheryl Quinn is an 89 year old female with interstitial lung disease who presents with a productive cough and suspected bronchitis.  She has been experiencing a productive cough that began on Sunday, initially accompanied by a sore throat. The cough has since worsened, and she I sent started expectorating thick, yellow phlegm two days ago. No fever or chills are present, and she does not feel more short of breath, although she is on continuous oxygen  therapy.  She is currently on 2 liters of oxygen , occasionally increasing to 3 liters depending on her activity level. She has a history of bronchitis occurring once or twice a year and recalls a previous hospitalization for pneumonia. Last year, a CT scan was performed, and recent lung function tests showed a decrease in diffusion capacity from 80 to 60. She reports fatigue and rests as needed.  She has a history of a breast mass, with a biopsy scheduled next Tuesday. Multiple mammograms and an ultrasound led to the decision for a biopsy.  Additionally, she has a history of sciatica, which was managed with three epidural injections, resulting in significant pain relief and discontinuation  of gabapentin.  A thyroid  nodule was biopsied in October 2024, showing nonspecific findings. She is uncertain who ordered the biopsy, but it was addressed at that time.    Outpatient Encounter Medications as of 04/07/2024  Medication Sig    ACCU-CHEK GUIDE test strip USE AS DIRECTED TO CHECK BLOOD SUGARS DAILY 90 DAYS   acetaminophen  (TYLENOL  8 HOUR) 650 MG CR tablet Take 650 mg by mouth daily as needed for pain.   albuterol  (PROVENTIL  HFA;VENTOLIN  HFA) 108 (90 BASE) MCG/ACT inhaler Inhale into the lungs every 6 (six) hours as needed for wheezing or shortness of breath.   ascorbic acid (VITAMIN C) 500 MG tablet Take 500 mg by mouth every evening.   brimonidine (ALPHAGAN P) 0.1 % SOLN Place 1 drop into both eyes in the morning and at bedtime.   CRESTOR  10 MG tablet Take 10 mg by mouth every evening.   cyanocobalamin  (VITAMIN B12) 1000 MCG tablet Take 1,000 mcg by mouth 3 (three) times a week. Three times weekly   dapagliflozin  propanediol (FARXIGA ) 10 MG TABS tablet TAKE 1 TABLET BY MOUTH EVERY DAY BEFORE BREAKFAST   diclofenac (FLECTOR) 1.3 % PTCH Place 1 patch onto the skin daily as needed (for painful areas).   dorzolamide-timolol (COSOPT) 22.3-6.8 MG/ML ophthalmic solution Place 1 drop into both eyes 2 (two) times daily.   famotidine  (PEPCID ) 10 MG tablet Take 10 mg by mouth daily as needed for heartburn.   furosemide  (LASIX ) 20 MG tablet TAKE 1 TABLET BY MOUTH EVERY DAY   glimepiride (AMARYL) 2 MG tablet Take 1-2 mg by mouth every morning. Take 1 tablet by mouth in the morning and 1/2 tablet with lunch   LUMIGAN 0.01 % SOLN Place 1 drop into both eyes at bedtime.   Menthol, Topical Analgesic, (BIOFREEZE EX) Apply 1 application  topically at bedtime as needed (for nerve pain in feet).   metFORMIN (GLUCOPHAGE-XR) 500 MG 24 hr tablet Take 500 mg by mouth 2 (two) times daily with a meal.   sacubitril -valsartan  (ENTRESTO ) 49-51 MG Take 1 tablet by mouth 2 (two) times daily.   spironolactone  (ALDACTONE ) 25 MG tablet TAKE 1/2 TABLET BY MOUTH DAILY   VITAMIN D PO Take 1,000 capsules by mouth every evening.   No facility-administered encounter medications on file as of 04/07/2024.   Physical Exam: Blood pressure 103/64, pulse 74,  temperature 98 F (36.7 C), temperature source Oral, height 5\' 1"  (1.549 m), weight 161 lb (73 kg), SpO2 97%. Gen:      No acute distress HEENT:  EOMI, sclera anicteric Neck:     No masses; no thyromegaly Lungs:    Clear to auscultation bilaterally; normal respiratory effort CV:         Regular rate and rhythm; no murmurs Abd:      + bowel sounds; soft, non-tender; no palpable masses, no distension Ext:    No edema; adequate peripheral perfusion Skin:      Warm and dry; no rash Neuro: alert and oriented x 3 Psych: normal mood and affect   Data Reviewed: Imaging: Chest x-ray 04/15/2022-diffuse interstitial thickening. Chest x-ray 05/06/2022-bibasal opacities Chest x-ray 06/05/2022-chronic interstitial opacities with bibasal atelectasis High-resolution CT chest 09/02/2022-mild pulmonary fibrosis with basal gradient, septal thickening, traction bronchiectasis and bronchiolectasis.  Probable UIP.  Kidney lesions noted. High resolution CT 08/05/2023-pulmonary pattern of pulmonary fibrosis stable compared to 2023.  Indeterminate for UIP.  2 cm left thyroid  nodule. I reviewed the images personally  PFTs: 09/15/2022 FVC 2.08 [111%], FEV1 2.04 [136%],  F/F 90, TLC 4.37 [93%], DLCO 14.29 [84%] Normal test, Airtrapping and bronchodilator response suggests minimal airway obstruction  03/29/2024 FVC 2.37 [121%], FEV1 2.09 [147%], F/F88, TLC 4.56 [97%], diffusion capacity 10.15 [60%] Moderate diffusion capacity.  Labs: CTD serologies 07/21/2022-ANA 1: 180, cytoplasmic.  Rheumatoid factor 6.6  Cardiac: Echocardiogram 04/15/2023-LVEF 45-50%, normal RV systolic function.  Normal PA systolic pressure, estimated RVSP 34.1  Assessment & Plan Acute bronchitis or pneumonia Presents with a cough productive of thick, yellow sputum, initiated after a sore throat. Symptoms have persisted since Sunday, with worsening cough and sputum production in the last two days. Recurrent bronchitis occurs once or twice  annually. Denies fever, chills, or increased dyspnea. Suspected acute infection, likely bronchitis or pneumonia. - Order chest x-ray to evaluate for pneumonia - Prescribe antibiotics - Prescribe prednisone   Interstitial lung disease Reviewed CT which shows mild fibrosis and probable UIP pattern.  She does have arthritis symptoms with elevated ANA and rheumatoid factor.  Noted to have exposure to down feather  We reviewed findings today and possibilities include chronic HP or IPF.  No evidence of rheumatoid arthritis on rheumatology evaluation.  I do not believe she is a good candidate for bronchoscopy or lung biopsy given her age. She has gotten rid of down pillows in December 2023.  Has not responded well to steroids which made her symptoms worse. After discussion at ILD conference it was felt that the presentation is more consistent with IPF.  No evidence of pulmonary hypertension on echocardiogram in 2024  Now off steroids.  We had prescribed Esbriet  but after review of side effects she would like to avoid and focus on symptom management and quality of life.  I agree with this decision as she has a tendency to have side effects with medications  Previous CT scan showed scarring, and recent pulmonary function tests indicate a decrease in diffusion capacity from 80 to 60, with slight improvement in lung volumes. Disease progression is expected. Pulmonary rehabilitation is advised to enhance exercise capacity and symptom management - Order CT scan in 3-4 months to assess scarring - Recommend pulmonary rehabilitation  but she would like to hold off for now  Asthma PFTs reviewed with trapping and bronchodilator response suggesting airway disease.  She also has trapping noted on CT scan Continue Symbicort   Breast mass Breast mass identified post-mammogram and ultrasound. Biopsy scheduled for next Tuesday. Prefers to avoid aggressive treatment, such as chemotherapy, if malignancy is confirmed,  considering her age and comorbidities. Treatment decisions will depend on biopsy results and tumor aggressiveness. - Proceed with scheduled breast biopsy  Thyroid  nodule Thyroid  nodule evaluated with fine needle aspiration in October 2024, revealing nonspecific findings. No further treatment planned.  Kidney lesions Noted on recent CT chest.  She has been referred to urology but has not heard from them.  She and her husband feel that they would not want surgery or chemotherapy even if malignancy was diagnosed and referral was canceled.  Sciatica Severe sciatica previously managed with high-dose acetaminophen  and gabapentin. Significant symptom improvement following three epidural injections, allowing discontinuation of other medications.  Goals of Care Prioritizes quality of life over aggressive treatment. Prefers to avoid aggressive interventions for breast mass and other conditions, considering her age and current health status. Acknowledges progressive nature of conditions, focusing on maintaining quality of life.    Plan/Recommendations: Z-Pak, prednisone , chest x-ray Continue Symbicort  High-res CT 4 months Follow-up in 6 months.  Eron Staat MD [facility] 04/07/2024, 9:22 AM  CC: Tena Feeling, MD

## 2024-04-11 ENCOUNTER — Ambulatory Visit: Payer: Self-pay | Admitting: Pulmonary Disease

## 2024-04-12 ENCOUNTER — Ambulatory Visit
Admission: RE | Admit: 2024-04-12 | Discharge: 2024-04-12 | Disposition: A | Discharge status: Home / Self Care | Source: Ambulatory Visit | Attending: Physician Assistant | Admitting: Physician Assistant

## 2024-04-12 DIAGNOSIS — N6310 Unspecified lump in the right breast, unspecified quadrant: Secondary | ICD-10-CM

## 2024-04-12 DIAGNOSIS — N6311 Unspecified lump in the right breast, upper outer quadrant: Secondary | ICD-10-CM | POA: Diagnosis not present

## 2024-04-12 DIAGNOSIS — N6489 Other specified disorders of breast: Secondary | ICD-10-CM | POA: Diagnosis not present

## 2024-04-12 HISTORY — PX: BREAST BIOPSY: SHX20

## 2024-04-13 LAB — SURGICAL PATHOLOGY

## 2024-04-28 DIAGNOSIS — M545 Low back pain, unspecified: Secondary | ICD-10-CM | POA: Diagnosis not present

## 2024-04-28 DIAGNOSIS — I10 Essential (primary) hypertension: Secondary | ICD-10-CM | POA: Diagnosis not present

## 2024-04-28 DIAGNOSIS — I519 Heart disease, unspecified: Secondary | ICD-10-CM | POA: Diagnosis not present

## 2024-04-28 DIAGNOSIS — J84112 Idiopathic pulmonary fibrosis: Secondary | ICD-10-CM | POA: Diagnosis not present

## 2024-04-28 DIAGNOSIS — I5032 Chronic diastolic (congestive) heart failure: Secondary | ICD-10-CM | POA: Diagnosis not present

## 2024-04-28 DIAGNOSIS — E1169 Type 2 diabetes mellitus with other specified complication: Secondary | ICD-10-CM | POA: Diagnosis not present

## 2024-06-24 DIAGNOSIS — H5201 Hypermetropia, right eye: Secondary | ICD-10-CM | POA: Diagnosis not present

## 2024-06-24 DIAGNOSIS — E119 Type 2 diabetes mellitus without complications: Secondary | ICD-10-CM | POA: Diagnosis not present

## 2024-06-24 DIAGNOSIS — Z961 Presence of intraocular lens: Secondary | ICD-10-CM | POA: Diagnosis not present

## 2024-06-24 DIAGNOSIS — H5212 Myopia, left eye: Secondary | ICD-10-CM | POA: Diagnosis not present

## 2024-06-24 DIAGNOSIS — H401133 Primary open-angle glaucoma, bilateral, severe stage: Secondary | ICD-10-CM | POA: Diagnosis not present

## 2024-06-28 DIAGNOSIS — E1165 Type 2 diabetes mellitus with hyperglycemia: Secondary | ICD-10-CM | POA: Diagnosis not present

## 2024-06-28 DIAGNOSIS — E1169 Type 2 diabetes mellitus with other specified complication: Secondary | ICD-10-CM | POA: Diagnosis not present

## 2024-07-20 DIAGNOSIS — M170 Bilateral primary osteoarthritis of knee: Secondary | ICD-10-CM | POA: Diagnosis not present

## 2024-07-27 DIAGNOSIS — M170 Bilateral primary osteoarthritis of knee: Secondary | ICD-10-CM | POA: Diagnosis not present

## 2024-08-03 DIAGNOSIS — M170 Bilateral primary osteoarthritis of knee: Secondary | ICD-10-CM | POA: Diagnosis not present

## 2024-08-10 ENCOUNTER — Ambulatory Visit
Admission: RE | Admit: 2024-08-10 | Discharge: 2024-08-10 | Disposition: A | Discharge status: Home / Self Care | Source: Ambulatory Visit | Attending: Pulmonary Disease

## 2024-08-10 ENCOUNTER — Ambulatory Visit: Admitting: Pulmonary Disease

## 2024-08-10 DIAGNOSIS — J849 Interstitial pulmonary disease, unspecified: Secondary | ICD-10-CM | POA: Diagnosis not present

## 2024-08-10 DIAGNOSIS — J479 Bronchiectasis, uncomplicated: Secondary | ICD-10-CM | POA: Diagnosis not present

## 2024-08-22 ENCOUNTER — Other Ambulatory Visit: Payer: Self-pay | Admitting: Cardiology

## 2024-08-22 MED ORDER — DAPAGLIFLOZIN PROPANEDIOL 10 MG PO TABS: 10.0000 mg | ORAL_TABLET | Freq: Every day | ORAL | 0 refills | Status: DC

## 2024-08-31 ENCOUNTER — Encounter: Payer: Self-pay | Admitting: Podiatry

## 2024-08-31 ENCOUNTER — Ambulatory Visit: Admitting: Podiatry

## 2024-08-31 ENCOUNTER — Encounter: Payer: Self-pay | Admitting: Pulmonary Disease

## 2024-08-31 ENCOUNTER — Ambulatory Visit: Admitting: Pulmonary Disease

## 2024-08-31 VITALS — BP 102/80 | HR 55 | Temp 97.8°F | Ht 60.0 in | Wt 161.0 lb

## 2024-08-31 DIAGNOSIS — J209 Acute bronchitis, unspecified: Secondary | ICD-10-CM | POA: Diagnosis not present

## 2024-08-31 DIAGNOSIS — Z87891 Personal history of nicotine dependence: Secondary | ICD-10-CM

## 2024-08-31 DIAGNOSIS — M5430 Sciatica, unspecified side: Secondary | ICD-10-CM

## 2024-08-31 DIAGNOSIS — L03032 Cellulitis of left toe: Secondary | ICD-10-CM

## 2024-08-31 DIAGNOSIS — J849 Interstitial pulmonary disease, unspecified: Secondary | ICD-10-CM | POA: Diagnosis not present

## 2024-08-31 DIAGNOSIS — L600 Ingrowing nail: Secondary | ICD-10-CM | POA: Diagnosis not present

## 2024-08-31 DIAGNOSIS — J45909 Unspecified asthma, uncomplicated: Secondary | ICD-10-CM | POA: Diagnosis not present

## 2024-08-31 DIAGNOSIS — R93429 Abnormal radiologic findings on diagnostic imaging of unspecified kidney: Secondary | ICD-10-CM

## 2024-08-31 NOTE — Patient Instructions (Signed)
  VISIT SUMMARY: Today, we reviewed your interstitial lung disease and chronic sciatic nerve pain. Your lung function has declined over the past two years, but your condition is stable with current management. We also discussed your oxygen  therapy and recurrent respiratory infections.  YOUR PLAN: INTERSTITIAL LUNG DISEASE WITH PULMONARY FIBROSIS: Your lung function has declined from 84% to 60% over the past two years. You are currently managing without pirfenidone  due to side effects. Your oxygen  levels are generally adequate, but they may drop during deep sleep. -Schedule a lung function test in May 2026. -Monitor your symptoms and start antibiotics and steroids if bronchitis flares up. -Continue using oxygen  at night and ensure it stays on during sleep.  CHRONIC SCIATIC NERVE PAIN: You have chronic sciatic nerve pain that is currently managed with gabapentin and acetaminophen . Your pain is less severe now, and the medication helps you sleep. -Continue taking gabapentin and acetaminophen  for pain management.

## 2024-08-31 NOTE — Progress Notes (Unsigned)
 DENIELLE BAYARD    [id number]    10/29/1933  Primary Care Physician:Miller, [place]  E, PA  Referring Physician: Alys Schuyler HERO, PA [address] [place],  [place] [address]  Chief complaint: Follow-up for interstitial lung disease  HPI: 89 y.o. who  has a past medical history of Arthritis, Asthma, Complete rupture of left rotator cuff (09/28/2015), Diabetes mellitus without complication (HCC), GERD (gastroesophageal reflux disease), Glaucoma, Hyperlipidemia, Hypertension, PONV (postoperative nausea and vomiting), and Rotator cuff tear.   Referred here for evaluation of interstitial lung disease.  She had cough and shortness of breath which is worsened after she developed pneumonia seen outpatient earlier this year.  She had several chest x-rays and at least 2 rounds of antibiotics with some improvement but continues to have mild persistent symptoms.  She has history of recurrent bronchitis and asthma but not on controller medication.  She is using albuterol  as needed.  History also notable for seasonal allergies and occasional GERD symptoms  She has seen Dr. Jeannetta from Rheumatology in November 2023 for elevated rheumatoid factor.  He feels there is no evidence of rheumatoid arthritis but she has advanced osteoarthritis.  She was given a trial of prednisone  in December 2023 for presumptive diagnosis of HP as she had a exposure to down She feels that the prednisone  actually make her breathing worse and sugars are elevated Reviewed her case at multidisciplinary conference in January 2024 and findings were felt to be more consistent with IPF  In December 2023 after informed decision making we have decided to try Esbriet  but after review of side effects they are having second thoughts on this medication and did not want to start it  Hospitalized in February 2023 with multifocal atrial tachycardia, acute hypoxic respiratory failure secondary to ILD and has been  started on supplemental oxygen  at 2 L.  Pets: No pets Occupation: Retired Occupational psychologist Exposures: Has down pillows.  No mold, hot tub, Jacuzzi. ILD questionnaire 07/21/2022-negative except for above Smoking history: Smoked 2 to 3 cigarettes a day for 45 years.  Quit in 2000 Travel history: No significant travel history Relevant family history: Sister has COPD  Interim history: Discussed the use of AI scribe software for clinical note transcription with the patient, who gave verbal consent to proceed.  History of Present Illness TYLENE QUASHIE is an 89 year old female with interstitial lung disease who presents with a productive cough and suspected bronchitis.  She has been experiencing a productive cough that began on Sunday, initially accompanied by a sore throat. The cough has since worsened, and she I sent started expectorating thick, yellow phlegm two days ago. No fever or chills are present, and she does not feel more short of breath, although she is on continuous oxygen  therapy.  She is currently on 2 liters of oxygen , occasionally increasing to 3 liters depending on her activity level. She has a history of bronchitis occurring once or twice a year and recalls a previous hospitalization for pneumonia. Last year, a CT scan was performed, and recent lung function tests showed a decrease in diffusion capacity from 80 to 60. She reports fatigue and rests as needed.  She has a history of a breast mass, with a biopsy scheduled next Tuesday. Multiple mammograms and an ultrasound led to the decision for a biopsy.  Additionally, she has a history of sciatica, which was managed with three epidural injections, resulting in significant pain relief and discontinuation  of gabapentin.  A thyroid  nodule was biopsied in October 2024, showing nonspecific findings. She is uncertain who ordered the biopsy, but it was addressed at that time.   BRENDA SAMANO is an 89 year old female with interstitial  lung disease who presents for follow-up.  Pulmonary function decline - Interstitial lung disease with progressive decline in lung function from 84% two years ago to 60% most recently - Previously treated with steroids for interstitial lung disease - Pirfenidone  discontinued due to side effects  Oxygen  therapy - Uses supplemental oxygen  at night - Oxygen  sometimes dislodges during sleep - Waking oxygen  saturation typically around 97%  Recurrent lower respiratory tract infections - Recurrent bronchitis two to three times per year - Episodes treated with antibiotics and prednisone  - No recent episodes of bronchitis    Outpatient Encounter Medications as of 08/31/2024  Medication Sig   ACCU-CHEK GUIDE test strip USE AS DIRECTED TO CHECK BLOOD SUGARS DAILY 90 DAYS   acetaminophen  (TYLENOL  8 HOUR) 650 MG CR tablet Take 650 mg by mouth daily as needed for pain.   albuterol  (PROVENTIL  HFA;VENTOLIN  HFA) 108 (90 BASE) MCG/ACT inhaler Inhale into the lungs every 6 (six) hours as needed for wheezing or shortness of breath.   ascorbic acid (VITAMIN C) 500 MG tablet Take 500 mg by mouth every evening.   brimonidine (ALPHAGAN P) 0.1 % SOLN Place 1 drop into both eyes in the morning and at bedtime.   CRESTOR  10 MG tablet Take 10 mg by mouth every evening.   cyanocobalamin  (VITAMIN B12) 1000 MCG tablet Take 1,000 mcg by mouth 3 (three) times a week. Three times weekly   dapagliflozin  propanediol (FARXIGA ) 10 MG TABS tablet Take 1 tablet (10 mg total) by mouth daily. TAKE 1 TABLET BY MOUTH EVERY DAY BEFORE BREAKFAST   diclofenac (FLECTOR) 1.3 % PTCH Place 1 patch onto the skin daily as needed (for painful areas).   dorzolamide-timolol (COSOPT) 22.3-6.8 MG/ML ophthalmic solution Place 1 drop into both eyes 2 (two) times daily.   famotidine  (PEPCID ) 10 MG tablet Take 10 mg by mouth daily as needed for heartburn.   furosemide  (LASIX ) 20 MG tablet TAKE 1 TABLET BY MOUTH EVERY DAY   glimepiride (AMARYL) 2 MG  tablet Take 1-2 mg by mouth every morning. Take 1 tablet by mouth in the morning and 1/2 tablet with lunch   LUMIGAN 0.01 % SOLN Place 1 drop into both eyes at bedtime.   Menthol, Topical Analgesic, (BIOFREEZE EX) Apply 1 application  topically at bedtime as needed (for nerve pain in feet).   metFORMIN (GLUCOPHAGE-XR) 500 MG 24 hr tablet Take 500 mg by mouth 2 (two) times daily with a meal.   sacubitril -valsartan  (ENTRESTO ) 49-51 MG Take 1 tablet by mouth 2 (two) times daily.   spironolactone  (ALDACTONE ) 25 MG tablet TAKE 1/2 TABLET BY MOUTH DAILY   VITAMIN D PO Take 1,000 capsules by mouth every evening.   azithromycin  (ZITHROMAX ) 250 MG tablet Take 500 mg on day 1 and then 250 mg/day for the next 4 days (Patient not taking: Reported on 08/31/2024)   No facility-administered encounter medications on file as of 08/31/2024.   Physical Exam: Blood pressure 103/64, pulse 74, temperature 98 F (36.7 C), temperature source Oral, height 5' 1 (1.549 m), weight 161 lb (73 kg), SpO2 97%. Gen:      No acute distress HEENT:  EOMI, sclera anicteric Neck:     No masses; no thyromegaly Lungs:    Clear to auscultation bilaterally;  normal respiratory effort CV:         Regular rate and rhythm; no murmurs Abd:      + bowel sounds; soft, non-tender; no palpable masses, no distension Ext:    No edema; adequate peripheral perfusion Skin:      Warm and dry; no rash Neuro: alert and oriented x 3 Psych: normal mood and affect   Data Reviewed: Imaging: Chest x-ray 04/15/2022-diffuse interstitial thickening. Chest x-ray 05/06/2022-bibasal opacities Chest x-ray 06/05/2022-chronic interstitial opacities with bibasal atelectasis High-resolution CT chest 09/02/2022-mild pulmonary fibrosis with basal gradient, septal thickening, traction bronchiectasis and bronchiolectasis.  Probable UIP.  Kidney lesions noted. High resolution CT 08/05/2023-pulmonary pattern of pulmonary fibrosis stable compared to 2023.  Indeterminate  for UIP.  2 cm left thyroid  nodule. I reviewed the images personally  PFTs: 09/15/2022 FVC 2.08 [111%], FEV1 2.04 [136%], F/F 90, TLC 4.37 [93%], DLCO 14.29 [84%] Normal test, Airtrapping and bronchodilator response suggests minimal airway obstruction  03/29/2024 FVC 2.37 [121%], FEV1 2.09 [147%], F/F88, TLC 4.56 [97%], diffusion capacity 10.15 [60%] Moderate diffusion capacity.  Labs: CTD serologies 07/21/2022-ANA 1: 180, cytoplasmic.  Rheumatoid factor 6.6  Cardiac: Echocardiogram 04/15/2023-LVEF 45-50%, normal RV systolic function.  Normal PA systolic pressure, estimated RVSP 34.1  Assessment & Plan Acute bronchitis or pneumonia Presents with a cough productive of thick, yellow sputum, initiated after a sore throat. Symptoms have persisted since Sunday, with worsening cough and sputum production in the last two days. Recurrent bronchitis occurs once or twice annually. Denies fever, chills, or increased dyspnea. Suspected acute infection, likely bronchitis or pneumonia. - Order chest x-ray to evaluate for pneumonia - Prescribe antibiotics - Prescribe prednisone   Interstitial lung disease Reviewed CT which shows mild fibrosis and probable UIP pattern.  She does have arthritis symptoms with elevated ANA and rheumatoid factor.  Noted to have exposure to down feather  We reviewed findings today and possibilities include chronic HP or IPF.  No evidence of rheumatoid arthritis on rheumatology evaluation.  I do not believe she is a good candidate for bronchoscopy or lung biopsy given her age. She has gotten rid of down pillows in December 2023.  Has not responded well to steroids which made her symptoms worse. After discussion at ILD conference it was felt that the presentation is more consistent with IPF.  No evidence of pulmonary hypertension on echocardiogram in 2024  Now off steroids.  We had prescribed Esbriet  but after review of side effects she would like to avoid and focus on symptom  management and quality of life.  I agree with this decision as she has a tendency to have side effects with medications  Previous CT scan showed scarring, and recent pulmonary function tests indicate a decrease in diffusion capacity from 80 to 60, with slight improvement in lung volumes. Disease progression is expected. Pulmonary rehabilitation is advised to enhance exercise capacity and symptom management - Order CT scan in 3-4 months to assess scarring - Recommend pulmonary rehabilitation  but she would like to hold off for now  Asthma PFTs reviewed with trapping and bronchodilator response suggesting airway disease.  She also has trapping noted on CT scan Continue Symbicort   Breast mass Breast mass identified post-mammogram and ultrasound. Biopsy scheduled for next Tuesday. Prefers to avoid aggressive treatment, such as chemotherapy, if malignancy is confirmed, considering her age and comorbidities. Treatment decisions will depend on biopsy results and tumor aggressiveness. - Proceed with scheduled breast biopsy  Thyroid  nodule Thyroid  nodule evaluated with fine needle aspiration in October  2024, revealing nonspecific findings. No further treatment planned.  Kidney lesions Noted on recent CT chest.  She has been referred to urology but has not heard from them.  She and her husband feel that they would not want surgery or chemotherapy even if malignancy was diagnosed and referral was canceled.  Sciatica Severe sciatica previously managed with high-dose acetaminophen  and gabapentin. Significant symptom improvement following three epidural injections, allowing discontinuation of other medications.  Goals of Care Prioritizes quality of life over aggressive treatment. Prefers to avoid aggressive interventions for breast mass and other conditions, considering her age and current health status. Acknowledges progressive nature of conditions, focusing on maintaining quality of  life.   Interstitial lung disease with pulmonary fibrosis Interstitial lung disease with pulmonary fibrosis is well-managed. CT scan shows no significant change from last year, although there has been progression since 2023. Lung function tests showed a decline from 84% to 60% over the past two years. Current management avoids pirfenidone  due to side effects. Oxygen  levels may drop during deep sleep due to shallow breathing, but daytime levels are adequate. No recent episodes of bronchitis, which previously required antibiotics and prednisone . - Schedule lung function test in May 2026 - Monitor symptoms and initiate antibiotics and steroids if bronchitis flares up - Continue using oxygen  at night, ensure it is on if it falls off during sleep  Chronic sciatic nerve pain Chronic sciatic nerve pain is managed with gabapentin and acetaminophen , which aids in sleep. Previous severe episodes required epidurals. Current pain is less severe, and gabapentin is effective in managing symptoms. - Continue gabapentin and acetaminophen  for pain management    Plan/Recommendations: Z-Pak, prednisone , chest x-ray Continue Symbicort  High-res CT 4 months Follow-up in 6 months.  Lonna Coder MD [facility] 08/31/2024, 4:21 PM  CC: [place], Schuyler HERO, [place]

## 2024-09-01 ENCOUNTER — Ambulatory Visit: Admitting: Pulmonary Disease

## 2024-09-01 DIAGNOSIS — E1169 Type 2 diabetes mellitus with other specified complication: Secondary | ICD-10-CM | POA: Diagnosis not present

## 2024-09-01 NOTE — Progress Notes (Signed)
 Subjective:   Patient ID: Cheryl Quinn, female   DOB: 89 y.o.   MRN: [id number]   HPI Patient presents in relative poor health with history of fibrosis lung and ingrown toenail deformity with some redness around the left big toenail that is been tender for her.  Patient states that she is not sure about whether it could be removed and we did have of history of ingrown toenail removal   ROS      Objective:  Physical Exam  Neurovascular status reveals mild diminishment of circulatory status bilateral neurological also mildly diminished with the patient found to have incurvated nailbeds with the left hallux being sore mostly in the medial corner with redness but no active drainage     Assessment:  Combination of ingrown toenail low-grade paronychia and just generalized nail disease     Plan:  H&P reviewed all conditions and with health and lung issues I would rather not do a permanent procedure so I did discuss soaks possible antibiotics if it were to get more irritated and I did careful debridement today courtesy and she is encouraged to call if this does not get better and may eventually require another procedure

## 2024-09-06 ENCOUNTER — Other Ambulatory Visit: Payer: Medicare PPO

## 2024-09-08 DIAGNOSIS — N289 Disorder of kidney and ureter, unspecified: Secondary | ICD-10-CM | POA: Diagnosis not present

## 2024-09-22 ENCOUNTER — Ambulatory Visit (HOSPITAL_BASED_OUTPATIENT_CLINIC_OR_DEPARTMENT_OTHER)
Admission: RE | Admit: 2024-09-22 | Discharge: 2024-09-22 | Disposition: A | Discharge status: Home / Self Care | Source: Ambulatory Visit | Attending: Physician Assistant | Admitting: Physician Assistant

## 2024-09-22 DIAGNOSIS — M810 Age-related osteoporosis without current pathological fracture: Secondary | ICD-10-CM | POA: Diagnosis not present

## 2024-09-22 DIAGNOSIS — Z780 Asymptomatic menopausal state: Secondary | ICD-10-CM | POA: Diagnosis not present

## 2024-09-27 DIAGNOSIS — D485 Neoplasm of uncertain behavior of skin: Secondary | ICD-10-CM | POA: Diagnosis not present

## 2024-09-27 DIAGNOSIS — L821 Other seborrheic keratosis: Secondary | ICD-10-CM | POA: Diagnosis not present

## 2024-09-27 DIAGNOSIS — Z85828 Personal history of other malignant neoplasm of skin: Secondary | ICD-10-CM | POA: Diagnosis not present

## 2024-09-27 DIAGNOSIS — L570 Actinic keratosis: Secondary | ICD-10-CM | POA: Diagnosis not present

## 2024-09-27 DIAGNOSIS — L739 Follicular disorder, unspecified: Secondary | ICD-10-CM | POA: Diagnosis not present

## 2024-09-27 DIAGNOSIS — D1801 Hemangioma of skin and subcutaneous tissue: Secondary | ICD-10-CM | POA: Diagnosis not present

## 2024-10-12 NOTE — Progress Notes (Signed)
 Cardiology Office Note:    Date:  10/18/2024   ID:  Cheryl Quinn, DOB 01-15-1933, MRN [id number]  PCP:  Cleotilde Debbora BRAVO, PA   [facility] Providers Cardiologist:  Shanicka Oldenkamp, MD { Pulmonologist: Dr. Theophilus  Referring MD: Alys Schuyler HERO, [place]   No chief complaint on file.   History of Present Illness:    Cheryl Quinn is a 89 y.o. female with a hx of hypertension, hyperlipidemia, DM2 and history of pulmonary fibrosis.  Patient was  admitted in late January 2024 with shortness of breath.  She was placed on supplemental oxygen  due to hypoxia.  Chest x-ray showed ill-defined opacity within the bilateral lung bases which may reflect atelectasis or pneumonia, 6 mm nodular opacity in the right lung base.  Cardiology service consulted for tachycardia.  Telemetry revealed mostly sinus tachycardia with PACs, suspect the patient has MAT given underlying pulmonary disease.  She also had new left bundle branch block since 2016.  She was placed on Cardizem .  TSH was normal.  Echocardiogram came back showing EF 40%, dyssynergy related to left bundle branch block.  Cardizem  was discontinued given LV dysfunction.  Patient was not placed on beta-blocker  due to underlying lung disease.  She was started on goal-directed medical therapy including losartan , spironolactone  and Farxiga .  She was seen in follow up in Feb by [facility]. It was noted her LBBB resolved. She was on Farxiga , losartan  and spironolactone . She was resumed on lasix  for edema. She had a cardiac PET CT that was low risk with no ischemia and EF 46% at rest and 51% with stress.   On follow up today she is seen with her husband. She states she has good days and bad days with her breathing. She is on a higher oxygen  dose now and pulmonary function has decreased per Dr Synthia. Swelling is stable. Weight stable. Notes a couple of times quite low BP readings.   Past Medical History:  Diagnosis Date   Arthritis     hips,shoulders   Asthma    Complete rupture of left rotator cuff 09/28/2015   Diabetes mellitus without complication (HCC)    GERD (gastroesophageal reflux disease)    with spicy foods   Glaucoma    Hyperlipidemia    Hypertension    PONV (postoperative nausea and vomiting)    Rotator cuff tear     Past Surgical History:  Procedure Laterality Date   ABDOMINAL HYSTERECTOMY     BLEPHAROPLASTY     BREAST BIOPSY Right 04/12/2024   US  RT BREAST BX W LOC DEV 1ST LESION IMG BX SPEC US  GUIDE 04/12/2024 [facility] MAMMOGRAP[facility]   BREAST EXCISIONAL BIOPSY Right 2003   Benign   BUNIONECTOMY WITH HAMMERTOE RECONSTRUCTION Bilateral    CHOLECYSTECTOMY     EYE SURGERY     cataract   SHOULDER ARTHROSCOPY WITH ROTATOR CUFF REPAIR Left 09/28/2015   Procedure: LEFT SHOULDER ARTHROSCOPY DEBRIDEMENT, ROTATOR CUFF REPAIR;  Surgeon: Fonda Olmsted, MD;  Location:  SURGERY CENTER;  Service: Orthopedics;  Laterality: Left;   TRIGGER FINGER RELEASE Left    WRIST FRACTURE SURGERY Right     Current Medications: Current Meds  Medication Sig   ACCU-CHEK GUIDE test strip USE AS DIRECTED TO CHECK BLOOD SUGARS DAILY 90 DAYS   acetaminophen  (TYLENOL  8 HOUR) 650 MG CR tablet Take 650 mg by mouth daily as needed for pain.   albuterol  (PROVENTIL  HFA;VENTOLIN  HFA) 108 (90 BASE) MCG/ACT inhaler Inhale into the lungs  every 6 (six) hours as needed for wheezing or shortness of breath.   ascorbic acid (VITAMIN C) 500 MG tablet Take 500 mg by mouth every evening.   brimonidine (ALPHAGAN P) 0.1 % SOLN Place 1 drop into both eyes in the morning and at bedtime.   CRESTOR  10 MG tablet Take 10 mg by mouth every evening.   cyanocobalamin  (VITAMIN B12) 1000 MCG tablet Take 1,000 mcg by mouth 3 (three) times a week. Three times weekly   dapagliflozin  propanediol (FARXIGA ) 10 MG TABS tablet Take 1 tablet (10 mg total) by mouth daily. TAKE 1 TABLET BY MOUTH EVERY DAY BEFORE BREAKFAST   diclofenac (FLECTOR) 1.3 % PTCH Place 1  patch onto the skin daily as needed (for painful areas).   dorzolamide-timolol (COSOPT) 22.3-6.8 MG/ML ophthalmic solution Place 1 drop into both eyes 2 (two) times daily.   famotidine  (PEPCID ) 10 MG tablet Take 10 mg by mouth daily as needed for heartburn.   furosemide  (LASIX ) 20 MG tablet TAKE 1 TABLET BY MOUTH EVERY DAY   gabapentin (NEURONTIN) 100 MG capsule Take 100 mg by mouth at bedtime.   LUMIGAN 0.01 % SOLN Place 1 drop into both eyes at bedtime.   Menthol, Topical Analgesic, (BIOFREEZE EX) Apply 1 application  topically at bedtime as needed (for nerve pain in feet).   metFORMIN (GLUCOPHAGE-XR) 500 MG 24 hr tablet Take 500 mg by mouth 2 (two) times daily with a meal.   sacubitril -valsartan  (ENTRESTO ) 24-26 MG Take 1 tablet by mouth 2 (two) times daily.   spironolactone  (ALDACTONE ) 25 MG tablet TAKE 1/2 TABLET BY MOUTH DAILY   VITAMIN D PO Take 1,000 capsules by mouth every evening.   [DISCONTINUED] sacubitril -valsartan  (ENTRESTO ) 49-51 MG Take 1 tablet by mouth 2 (two) times daily.     Allergies:   Oxycodone , Duricef [cefadroxil], Lipitor [atorvastatin], Lisinopril, and Vicodin [hydrocodone-acetaminophen ]   Social History   Socioeconomic History   Marital status: Married    Spouse name: Not on file   Number of children: Not on file   Years of education: Not on file   Highest education level: Not on file  Occupational History   Not on file  Tobacco Use   Smoking status: Former    Current packs/day: 0.00    Types: Cigarettes    Quit date: 1998    Years since quitting: 27.9    Passive exposure: Never   Smokeless tobacco: Never   Tobacco comments:    Smoked only 1-2 cigarettes per day  Vaping Use   Vaping status: Never Used  Substance and Sexual Activity   Alcohol use: Yes    Comment: social   Drug use: No   Sexual activity: Not on file  Other Topics Concern   Not on file  Social History Narrative   Not on file   Social Drivers of Health   Financial Resource  Strain: Not on file  Food Insecurity: Unknown (11/26/2022)   Received from [facility] [employer] Insecurity    In the past 3 months, have you had to go without food for 24 hours, multiple times due to lack of resources?: Not on file  Transportation Needs: Unknown (11/26/2022)   Received from [employer] Needs    In the past 3 months, has lack of transporation kept you from medical appointments or getting things you need that are essential to your health?: Not on file  Physical Activity: Not on file  Stress: Not on file  Social  Connections: Unknown (01/15/2023)   Received from [facility]   Social Connections    In the past 3 months, do you feel that you lack companionship or social support?: Not on file     Family History: The patient's family history includes COPD in her sister.  ROS:   Please see the history of present illness.     All other systems reviewed and are negative.  EKGs/Labs/Other Studies Reviewed:    The following studies were reviewed today:  Echo 12/25/2022 1. Left ventricular ejection fraction, by estimation, is 35 to 40%. The  left ventricle has moderately decreased function. The left ventricle has  no regional wall motion abnormalities. Left ventricular diastolic  parameters are consistent with Grade I  diastolic dysfunction (impaired relaxation). Septal dyssynergy due to  LBBB.   2. Right ventricular systolic function is normal. The right ventricular  size is normal.   3. Left atrial size was moderately dilated.   4. The mitral valve is normal in structure. Trivial mitral valve  regurgitation. No evidence of mitral stenosis.   5. The aortic valve is calcified. There is mild calcification of the  aortic valve. Aortic valve regurgitation is mild. No aortic stenosis is  present.   6. The inferior vena cava is normal in size with greater than 50%  respiratory variability, suggesting right atrial pressure of 3 mmHg.   Cardiac PET CT  02/25/23: Study Result  Narrative & Impression      LV perfusion is normal. There is no evidence of ischemia. There is no evidence of infarction.   Rest left ventricular function is normal. Rest EF: 46 %. Stress left ventricular function is normal. Stress EF: 51 %. End diastolic cavity size is normal. End systolic cavity size is normal.   Myocardial blood flow was computed to be 0.44ml/g/min at rest and 1.75ml/g/min at stress. Global myocardial blood flow reserve was 2.16 and was normal.   Coronary calcium  was absent on the attenuation correction CT images.   The study is normal. The study is low risk.   Electronically signed by Darryle Decent, MD   Echo 04/15/23: IMPRESSIONS     1. Left ventricular ejection fraction, by estimation, is 45 to 50%. The  left ventricle has mildly decreased function. The left ventricle  demonstrates global hypokinesis.   2. Right ventricular systolic function is normal. The right ventricular  size is normal. There is normal pulmonary artery systolic pressure. The  estimated right ventricular systolic pressure is 34.1 mmHg.   3. The mitral valve is normal in structure. No evidence of mitral valve  regurgitation. No evidence of mitral stenosis.   4. The aortic valve is tricuspid. Aortic valve regurgitation is trivial.  No aortic stenosis is present.   5. The inferior vena cava is normal in size with greater than 50%  respiratory variability, suggesting right atrial pressure of 3 mmHg.    EKG:  EKG is not ordered today.    Recent Labs: No results found for requested labs within last 365 days.  Recent Lipid Panel No results found for: CHOL, TRIG, HDL, CHOLHDL, VLDL, LDLCALC, LDLDIRECT  Dated 03/25/23: A1c 6.1% Dated 12/28/23: A1c 6.2%. Cholesterol 137, triglycerides 140, HDL 65, LDL 48. LFTs normal. Renal function normal. Potassium normal.    Risk Assessment/Calculations:           Physical Exam:    VS:  BP 104/64 (BP Location: Left Arm,  Patient Position: Sitting, Cuff Size: Large)   Pulse 69   Ht  5' 1 (1.549 m)   Wt 157 lb (71.2 kg)   SpO2 96%   BMI 29.66 kg/m         Wt Readings from Last 3 Encounters:  10/18/24 157 lb (71.2 kg)  08/31/24 161 lb (73 kg)  04/07/24 161 lb (73 kg)     GEN:  Well nourished, well developed in no acute distress HEENT: Normal NECK: No JVD; No carotid bruits LYMPHATICS: No lymphadenopathy CARDIAC: RRR, no murmurs, rubs, gallops RESPIRATORY:  Clear to auscultation without rales, wheezing or rhonchi  ABDOMEN: Soft, non-tender, non-distended MUSCULOSKELETAL:  1+ edema; No deformity  SKIN: Warm and dry NEUROLOGIC:  Alert and oriented x 3 PSYCHIATRIC:  Normal affect   ASSESSMENT:    1. Chronic systolic CHF (congestive heart failure) (HCC)   2. ILD (interstitial lung disease) (HCC)   3. Pulmonary fibrosis (HCC)   4. Multifocal atrial tachycardia      PLAN:    In order of problems listed above:  LV dysfunction: EF 35-40% on echocardiogram 12/25/2022.  Currently on losartan ,  spironolactone , and Farxiga .  EF improved to 40-45% by Echo. 46% by PET CT.  No evidence of ischemia on stress testing. She is euvolemic today. On Jardiance, aldactone  and Entresto . Given low BP readings will reduce Entresto  to 24/26 mg bid. Continue lasix .   Hypertension: Blood pressure controlled.  Hyperlipidemia: On Crestor . LDL looked great at 48  DM2: Managed by primary care provider  5.   Pulmonary fibrosis. Per pulmonary. On oxygen   6.   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 Improved.      Will follow up in 6 months    Signed, Rubina Basinski MD, FACC  10/18/2024 3:55 PM    [facility]

## 2024-10-13 DIAGNOSIS — N289 Disorder of kidney and ureter, unspecified: Secondary | ICD-10-CM | POA: Diagnosis not present

## 2024-10-18 ENCOUNTER — Ambulatory Visit: Attending: Cardiology | Admitting: Cardiology

## 2024-10-18 ENCOUNTER — Encounter: Payer: Self-pay | Admitting: Cardiology

## 2024-10-18 VITALS — BP 104/64 | HR 69 | Ht 61.0 in | Wt 157.0 lb

## 2024-10-18 DIAGNOSIS — I4719 Other supraventricular tachycardia: Secondary | ICD-10-CM | POA: Diagnosis not present

## 2024-10-18 DIAGNOSIS — I5022 Chronic systolic (congestive) heart failure: Secondary | ICD-10-CM | POA: Diagnosis not present

## 2024-10-18 DIAGNOSIS — J8410 Pulmonary fibrosis, unspecified: Secondary | ICD-10-CM

## 2024-10-18 DIAGNOSIS — J849 Interstitial pulmonary disease, unspecified: Secondary | ICD-10-CM

## 2024-10-18 MED ORDER — SACUBITRIL-VALSARTAN 24-26 MG PO TABS: 1.0000 | ORAL_TABLET | Freq: Two times a day (BID) | ORAL | 3 refills | Status: AC

## 2024-10-18 NOTE — Patient Instructions (Signed)
 Medication Instructions:  Decrease Entresto  to 24/26 mg twice a day Continue all other medications *If you need a refill on your cardiac medications before your next appointment, please call your pharmacy*  Lab Work: None ordered  Testing/Procedures: None ordered  Follow-Up: At [facility], you and your health needs are our priority.  As part of our continuing mission to provide you with exceptional heart care, our providers are all part of one team.  This team includes your primary Cardiologist (physician) and Advanced Practice Providers or APPs (Physician Assistants and Nurse Practitioners) who all work together to provide you with the care you need, when you need it.  Your next appointment:  6 months   Call in Feb to schedule May appointment     Provider:  Dr.Jordan     We recommend signing up for the patient portal called MyChart.  Sign up information is provided on this After Visit Summary.  MyChart is used to connect with patients for Virtual Visits (Telemedicine).  Patients are able to view lab/test results, encounter notes, upcoming appointments, etc.  Non-urgent messages can be sent to your provider as well.   To learn more about what you can do with MyChart, go to [email or web address].

## 2024-12-10 ENCOUNTER — Other Ambulatory Visit: Payer: Self-pay | Admitting: Cardiology

## 2024-12-27 ENCOUNTER — Other Ambulatory Visit: Payer: Self-pay

## 2025-03-29 ENCOUNTER — Encounter

## 2025-03-29 ENCOUNTER — Ambulatory Visit: Admitting: Pulmonary Disease

## 2025-04-25 ENCOUNTER — Ambulatory Visit: Admitting: Cardiology
